# Patient Record
Sex: Male | Born: 1935 | Race: White | Hispanic: No | Marital: Married | Smoking: Former smoker
Health system: Southern US, Community
[De-identification: ages and names within clinical notes are randomized; demographics above are authoritative.]

## PROBLEM LIST (undated history)

## (undated) DIAGNOSIS — N529 Male erectile dysfunction, unspecified: Secondary | ICD-10-CM

## (undated) DIAGNOSIS — C189 Malignant neoplasm of colon, unspecified: Secondary | ICD-10-CM

## (undated) DIAGNOSIS — E785 Hyperlipidemia, unspecified: Secondary | ICD-10-CM

## (undated) DIAGNOSIS — M5136 Other intervertebral disc degeneration, lumbar region: Secondary | ICD-10-CM

## (undated) DIAGNOSIS — E669 Obesity, unspecified: Secondary | ICD-10-CM

## (undated) DIAGNOSIS — E041 Nontoxic single thyroid nodule: Secondary | ICD-10-CM

## (undated) DIAGNOSIS — M1990 Unspecified osteoarthritis, unspecified site: Secondary | ICD-10-CM

## (undated) DIAGNOSIS — I6529 Occlusion and stenosis of unspecified carotid artery: Secondary | ICD-10-CM

## (undated) DIAGNOSIS — K5730 Diverticulosis of large intestine without perforation or abscess without bleeding: Secondary | ICD-10-CM

## (undated) DIAGNOSIS — G709 Myoneural disorder, unspecified: Secondary | ICD-10-CM

## (undated) DIAGNOSIS — R911 Solitary pulmonary nodule: Secondary | ICD-10-CM

## (undated) DIAGNOSIS — D649 Anemia, unspecified: Secondary | ICD-10-CM

## (undated) DIAGNOSIS — I2510 Atherosclerotic heart disease of native coronary artery without angina pectoris: Secondary | ICD-10-CM

## (undated) DIAGNOSIS — R202 Paresthesia of skin: Secondary | ICD-10-CM

## (undated) DIAGNOSIS — I10 Essential (primary) hypertension: Secondary | ICD-10-CM

## (undated) DIAGNOSIS — M51369 Other intervertebral disc degeneration, lumbar region without mention of lumbar back pain or lower extremity pain: Secondary | ICD-10-CM

## (undated) DIAGNOSIS — K635 Polyp of colon: Secondary | ICD-10-CM

## (undated) DIAGNOSIS — H269 Unspecified cataract: Secondary | ICD-10-CM

## (undated) DIAGNOSIS — I4430 Unspecified atrioventricular block: Secondary | ICD-10-CM

## (undated) DIAGNOSIS — R609 Edema, unspecified: Secondary | ICD-10-CM

## (undated) HISTORY — DX: Atherosclerotic heart disease of native coronary artery without angina pectoris: I25.10

## (undated) HISTORY — DX: Anemia, unspecified: D64.9

## (undated) HISTORY — PX: POLYPECTOMY: SHX149

## (undated) HISTORY — DX: Unspecified osteoarthritis, unspecified site: M19.90

## (undated) HISTORY — DX: Myoneural disorder, unspecified: G70.9

## (undated) HISTORY — DX: Occlusion and stenosis of unspecified carotid artery: I65.29

## (undated) HISTORY — DX: Unspecified cataract: H26.9

## (undated) HISTORY — DX: Diverticulosis of large intestine without perforation or abscess without bleeding: K57.30

## (undated) HISTORY — DX: Polyp of colon: K63.5

## (undated) HISTORY — PX: MOHS SURGERY: SUR867

## (undated) HISTORY — DX: Malignant neoplasm of colon, unspecified: C18.9

## (undated) HISTORY — DX: Nontoxic single thyroid nodule: E04.1

## (undated) HISTORY — DX: Other intervertebral disc degeneration, lumbar region: M51.36

## (undated) HISTORY — DX: Hyperlipidemia, unspecified: E78.5

## (undated) HISTORY — DX: Male erectile dysfunction, unspecified: N52.9

## (undated) HISTORY — DX: Solitary pulmonary nodule: R91.1

## (undated) HISTORY — DX: Unspecified atrioventricular block: I44.30

## (undated) HISTORY — PX: UPPER GASTROINTESTINAL ENDOSCOPY: SHX188

## (undated) HISTORY — DX: Paresthesia of skin: R20.2

## (undated) HISTORY — DX: Edema, unspecified: R60.9

## (undated) HISTORY — DX: Obesity, unspecified: E66.9

## (undated) HISTORY — PX: COLONOSCOPY: SHX174

## (undated) HISTORY — DX: Other intervertebral disc degeneration, lumbar region without mention of lumbar back pain or lower extremity pain: M51.369

## (undated) HISTORY — PX: TONSILLECTOMY AND ADENOIDECTOMY: SUR1326

## (undated) HISTORY — DX: Essential (primary) hypertension: I10

## (undated) HISTORY — PX: CATARACT EXTRACTION: SUR2

---

## 1998-03-22 ENCOUNTER — Encounter: Payer: Self-pay | Admitting: Gastroenterology

## 1998-03-22 ENCOUNTER — Ambulatory Visit (HOSPITAL_COMMUNITY): Admission: RE | Admit: 1998-03-22 | Discharge: 1998-03-22 | Payer: Self-pay | Admitting: Gastroenterology

## 2006-02-20 ENCOUNTER — Ambulatory Visit: Payer: Self-pay | Admitting: Gastroenterology

## 2006-03-05 ENCOUNTER — Encounter (INDEPENDENT_AMBULATORY_CARE_PROVIDER_SITE_OTHER): Payer: Self-pay | Admitting: Specialist

## 2006-03-05 ENCOUNTER — Ambulatory Visit: Payer: Self-pay | Admitting: Gastroenterology

## 2006-04-05 ENCOUNTER — Ambulatory Visit: Payer: Self-pay | Admitting: Gastroenterology

## 2006-08-16 ENCOUNTER — Ambulatory Visit: Payer: Self-pay | Admitting: Gastroenterology

## 2006-08-23 ENCOUNTER — Ambulatory Visit (HOSPITAL_COMMUNITY): Admission: RE | Admit: 2006-08-23 | Discharge: 2006-08-23 | Payer: Self-pay | Admitting: Gastroenterology

## 2006-08-28 ENCOUNTER — Ambulatory Visit: Payer: Self-pay | Admitting: Gastroenterology

## 2009-06-12 HISTORY — PX: COLON RESECTION: SHX5231

## 2009-08-03 ENCOUNTER — Encounter (INDEPENDENT_AMBULATORY_CARE_PROVIDER_SITE_OTHER): Payer: Self-pay | Admitting: *Deleted

## 2009-11-16 ENCOUNTER — Encounter (INDEPENDENT_AMBULATORY_CARE_PROVIDER_SITE_OTHER): Payer: Self-pay | Admitting: *Deleted

## 2009-11-18 ENCOUNTER — Ambulatory Visit: Payer: Self-pay | Admitting: Gastroenterology

## 2009-11-25 ENCOUNTER — Ambulatory Visit: Payer: Self-pay | Admitting: Gastroenterology

## 2009-11-26 ENCOUNTER — Telehealth: Payer: Self-pay | Admitting: Gastroenterology

## 2009-11-26 ENCOUNTER — Ambulatory Visit: Payer: Self-pay | Admitting: Cardiology

## 2009-11-26 ENCOUNTER — Ambulatory Visit: Payer: Self-pay | Admitting: Gastroenterology

## 2009-11-26 DIAGNOSIS — C189 Malignant neoplasm of colon, unspecified: Secondary | ICD-10-CM | POA: Insufficient documentation

## 2009-11-26 LAB — CONVERTED CEMR LAB: Creatinine, Ser: 0.9 mg/dL (ref 0.4–1.5)

## 2009-11-30 ENCOUNTER — Encounter: Payer: Self-pay | Admitting: Gastroenterology

## 2009-12-16 ENCOUNTER — Inpatient Hospital Stay (HOSPITAL_COMMUNITY): Admission: RE | Admit: 2009-12-16 | Discharge: 2009-12-20 | Payer: Self-pay | Admitting: Surgery

## 2009-12-16 ENCOUNTER — Encounter (INDEPENDENT_AMBULATORY_CARE_PROVIDER_SITE_OTHER): Payer: Self-pay | Admitting: Surgery

## 2009-12-20 ENCOUNTER — Encounter: Payer: Self-pay | Admitting: Gastroenterology

## 2009-12-27 ENCOUNTER — Ambulatory Visit: Payer: Self-pay | Admitting: Hematology and Oncology

## 2010-01-04 ENCOUNTER — Encounter: Payer: Self-pay | Admitting: Gastroenterology

## 2010-01-04 LAB — CBC WITH DIFFERENTIAL/PLATELET
BASO%: 0.5 % (ref 0.0–2.0)
Basophils Absolute: 0 10e3/uL (ref 0.0–0.1)
EOS%: 1.2 % (ref 0.0–7.0)
Eosinophils Absolute: 0.1 10e3/uL (ref 0.0–0.5)
HCT: 32.7 % — ABNORMAL LOW (ref 38.4–49.9)
HGB: 11.6 g/dL — ABNORMAL LOW (ref 13.0–17.1)
LYMPH%: 16.7 % (ref 14.0–49.0)
MCH: 34 pg — ABNORMAL HIGH (ref 27.2–33.4)
MCHC: 35.5 g/dL (ref 32.0–36.0)
MCV: 95.8 fL (ref 79.3–98.0)
MONO#: 0.6 10e3/uL (ref 0.1–0.9)
MONO%: 11.8 % (ref 0.0–14.0)
NEUT#: 3.4 10e3/uL (ref 1.5–6.5)
NEUT%: 69.8 % (ref 39.0–75.0)
Platelets: 369 10e3/uL (ref 140–400)
RBC: 3.41 10e6/uL — ABNORMAL LOW (ref 4.20–5.82)
RDW: 13 % (ref 11.0–14.6)
WBC: 4.9 10e3/uL (ref 4.0–10.3)
lymph#: 0.8 10e3/uL — ABNORMAL LOW (ref 0.9–3.3)

## 2010-01-04 LAB — CEA: CEA: 1 ng/mL (ref 0.0–5.0)

## 2010-01-04 LAB — COMPREHENSIVE METABOLIC PANEL WITH GFR
ALT: 20 U/L (ref 0–53)
AST: 20 U/L (ref 0–37)
Albumin: 4.2 g/dL (ref 3.5–5.2)
Alkaline Phosphatase: 66 U/L (ref 39–117)
BUN: 24 mg/dL — ABNORMAL HIGH (ref 6–23)
CO2: 19 meq/L (ref 19–32)
Calcium: 9.6 mg/dL (ref 8.4–10.5)
Chloride: 99 meq/L (ref 96–112)
Creatinine, Ser: 1.2 mg/dL (ref 0.40–1.50)
Glucose, Bld: 109 mg/dL — ABNORMAL HIGH (ref 70–99)
Potassium: 4.5 meq/L (ref 3.5–5.3)
Sodium: 134 meq/L — ABNORMAL LOW (ref 135–145)
Total Bilirubin: 0.9 mg/dL (ref 0.3–1.2)
Total Protein: 6.5 g/dL (ref 6.0–8.3)

## 2010-01-04 LAB — IRON AND TIBC
Iron: 112 ug/dL (ref 42–165)
UIBC: 213 ug/dL

## 2010-01-04 LAB — FERRITIN: Ferritin: 374 ng/mL — ABNORMAL HIGH (ref 22–322)

## 2010-01-05 ENCOUNTER — Encounter: Payer: Self-pay | Admitting: Gastroenterology

## 2010-01-05 ENCOUNTER — Ambulatory Visit (HOSPITAL_COMMUNITY): Admission: RE | Admit: 2010-01-05 | Discharge: 2010-01-05 | Payer: Self-pay | Admitting: Hematology and Oncology

## 2010-05-12 ENCOUNTER — Ambulatory Visit: Payer: Self-pay | Admitting: Hematology and Oncology

## 2010-05-16 ENCOUNTER — Ambulatory Visit (HOSPITAL_COMMUNITY)
Admission: RE | Admit: 2010-05-16 | Discharge: 2010-05-16 | Payer: Self-pay | Source: Home / Self Care | Admitting: Hematology and Oncology

## 2010-05-18 ENCOUNTER — Encounter: Payer: Self-pay | Admitting: Gastroenterology

## 2010-05-20 ENCOUNTER — Ambulatory Visit (HOSPITAL_COMMUNITY)
Admission: RE | Admit: 2010-05-20 | Discharge: 2010-05-20 | Payer: Self-pay | Source: Home / Self Care | Attending: Hematology and Oncology | Admitting: Hematology and Oncology

## 2010-07-01 ENCOUNTER — Other Ambulatory Visit: Payer: Self-pay | Admitting: Hematology and Oncology

## 2010-07-01 DIAGNOSIS — C189 Malignant neoplasm of colon, unspecified: Secondary | ICD-10-CM

## 2010-07-12 NOTE — Letter (Signed)
Summary: [facility]   Imported By: Lester H[place] 01/10/2010 10:51:52  _____________________________________________________________________  External Attachment:    Type:   Image     Comment:   External Document

## 2010-07-12 NOTE — Letter (Signed)
Summary: [facility]   Imported By: Lester Trimble 02/22/2010 09:43:51  _____________________________________________________________________  External Attachment:    Type:   Image     Comment:   External Document

## 2010-07-12 NOTE — Letter (Signed)
Summary: Colonoscopy Letter  [facility]  [address] [place], [place] [address]   Phone: [phone]  Fax: [phone]      August 03, 2009 MRN: [id number]   Ronnie Robinson [address] [place], [place]  [address]   Dear Mr. WITHERS,   According to your medical record, it is time for you to schedule a Colonoscopy. The American Cancer Society recommends this procedure as a method to detect early colon cancer. Patients with a family history of colon cancer, or a personal history of colon polyps or inflammatory bowel disease are at increased risk.  This letter has beeen generated based on the recommendations made at the time of your procedure. If you feel that in your particular situation this may no longer apply, please contact our office.  Please call our office at [phone] to schedule this appointment or to update your records at your earliest convenience.  Thank you for cooperating with [place] to provide you with the very best care possible.   Sincerely,  Barbette Hair. Arlyce Dice, M.D.  [facility] [phone]

## 2010-07-12 NOTE — Procedures (Signed)
Summary: Colonoscopy  Patient: Ronnie Robinson Note: All result statuses are Final unless otherwise noted.  Tests: (1) Colonoscopy (COL)   COL Colonoscopy           DONE     [facility]     [address]     [place], [place]  [address]           COLONOSCOPY PROCEDURE REPORT           PATIENT:  [name], [name]  MR#:  [id number]     BIRTHDATE:  27-Oct-1931, 78 yrs. old  GENDER:  male           ENDOSCOPIST:  Barbette Hair. Arlyce Dice, MD     Referred by:           PROCEDURE DATE:  11/25/2009     PROCEDURE:  Colonoscopy with biopsy     ASA CLASS:  Class II     INDICATIONS:  1) screening  2) history of pre-cancerous     (adenomatous) colon polyps           MEDICATIONS:   Fentanyl 50 mcg IV, Versed 6 mg IV           DESCRIPTION OF PROCEDURE:   After the risks benefits and     alternatives of the procedure were thoroughly explained, informed     consent was obtained.  No rectal exam performed. The LB CF-[id number]     [id number] endoscope was introduced through the anus and advanced to     the cecum, which was identified by both the appendix and ileocecal     valve, without limitations.  The quality of the prep was     excellent, using MoviPrep.  The instrument was then slowly     withdrawn as the colon was fully examined.     <<PROCEDUREIMAGES>>           FINDINGS:  A sessile polyp was found in the ascending colon. It     was 2 cm in size. Sessile relatively flat polyp proximal ascending     colon. Multiple biopsies were taken (see image7 and image3).     Severe diverticulosis was found in the sigmoid colon (see image2).     Scattered diverticula were found (see image1 and image9).     descending colon to ascending colon  This was otherwise a normal     examination of the colon (see image5, image6, image10, image12,     image18, and image19).   Retroflexed views in the rectum revealed     no abnormalities.    The time to cecum =  5.0  minutes. The scope     was then withdrawn (time =  6.25   min) from the patient and the     procedure completed.           COMPLICATIONS:  None           ENDOSCOPIC IMPRESSION:     1) 2 cm sessile polyp in the ascending colon     2) Severe diverticulosis in the sigmoid colon     3) Diverticula, scattered     4) Otherwise normal examination     RECOMMENDATIONS:     1) Await biopsy results           REPEAT EXAM:   You will receive a letter from Dr. Arlyce Dice in 1-2     weeks, after reviewing the final pathology, with followup     recommendations.  ______________________________     Barbette Hair Arlyce Dice, MD           CC: Sigmund Hazel, MD           n.     Rosalie Doctor:   Barbette Hair. Evadene Wardrip at 11/25/2009 11:07 AM           Page 2 of 3   [place]gomery, Favor [id number]  Note: An exclamation mark (!) indicates a result that was not dispersed into the flowsheet. Document Creation Date: 11/25/2009 11:07 AM _______________________________________________________________________  (1) Order result status: Final Collection or observation date-time: 11/25/2009 10:57 Requested date-time:  Receipt date-time:  Reported date-time:  Referring Physician:   Ordering Physician: Melvia Heaps [phone]) Specimen Source:  Source: Launa Grill Order Number: [id number] Lab site:

## 2010-07-12 NOTE — Letter (Signed)
[facility]   Imported By: Sherian Rein 01/24/2010 12:24:09  _____________________________________________________________________  External Attachment:    Type:   Image     Comment:   External Document

## 2010-07-12 NOTE — Letter (Signed)
Summary: [facility] Instructions  [facility]  [address] [place], [place] [address]   Phone: [phone]  Fax: [phone]       Ronnie Robinson    February 21, 1932    MRN: [id number]        Procedure Day /Date:  Thursday 11/25/2009     Arrival Time: 9:30 am      Procedure Time: 10:30 am     Location of Procedure:                    _x _  [facility] (4th Floor)                        PREPARATION FOR COLONOSCOPY WITH MOVIPREP   Starting 5 days prior to your procedure Saturday 6/11 do not eat nuts, seeds, popcorn, corn, beans, peas,  salads, or any raw vegetables.  Do not take any fiber supplements (e.g. Metamucil, Citrucel, and Benefiber).  THE DAY BEFORE YOUR PROCEDURE         DATE: Wednesday 6/15  1.  Drink clear liquids the entire day-NO SOLID FOOD  2.  Do not drink anything colored red or purple.  Avoid juices with pulp.  No orange juice.  3.  Drink at least 64 oz. (8 glasses) of fluid/clear liquids during the day to prevent dehydration and help the prep work efficiently.  CLEAR LIQUIDS INCLUDE: Water Jello Ice Popsicles Tea (sugar ok, no milk/cream) Powdered fruit flavored drinks Coffee (sugar ok, no milk/cream) Gatorade Juice: apple, white grape, white cranberry  Lemonade Clear bullion, consomm, broth Carbonated beverages (any kind) Strained chicken noodle soup Hard Candy                             4.  In the morning, mix first dose of MoviPrep solution:    Empty 1 Pouch A and 1 Pouch B into the disposable container    Add lukewarm drinking water to the top line of the container. Mix to dissolve    Refrigerate (mixed solution should be used within 24 hrs)  5.  Begin drinking the prep at 5:00 p.m. The MoviPrep container is divided by 4 marks.   Every 15 minutes drink the solution down to the next mark (approximately 8 oz) until the full liter is complete.   6.  Follow completed prep with 16 oz of clear liquid of your choice  (Nothing red or purple).  Continue to drink clear liquids until bedtime.  7.  Before going to bed, mix second dose of MoviPrep solution:    Empty 1 Pouch A and 1 Pouch B into the disposable container    Add lukewarm drinking water to the top line of the container. Mix to dissolve    Refrigerate  THE DAY OF YOUR PROCEDURE      DATE: Thursday 6/16  Beginning at 5:30 a.m. (5 hours before procedure):         1. Every 15 minutes, drink the solution down to the next mark (approx 8 oz) until the full liter is complete.  2. Follow completed prep with 16 oz. of clear liquid of your choice.    3. You may drink clear liquids until 8:30 am  (2 HOURS BEFORE PROCEDURE).   MEDICATION INSTRUCTIONS  Unless otherwise instructed, you should take regular prescription medications with a small sip of water   as early as possible the morning  of your procedure.  Additional medication instructions: Do not take HCTZ morning of procedure.                                                  Stop Iron 5 days before procedure.       OTHER INSTRUCTIONS  You will need a responsible adult at least 75 years of age to accompany you and drive you home.   This person must remain in the waiting room during your procedure.  Wear loose fitting clothing that is easily removed.  Leave jewelry and other valuables at home.  However, you may wish to bring a book to read or  an iPod/MP3 player to listen to music as you wait for your procedure to start.  Remove all body piercing jewelry and leave at home.  Total time from sign-in until discharge is approximately 2-3 hours.  You should go home directly after your procedure and rest.  You can resume normal activities the  day after your procedure.  The day of your procedure you should not:   Drive   Make legal decisions   Operate machinery   Drink alcohol   Return to work  You will receive specific instructions about eating, activities and medications  before you leave.    The above instructions have been reviewed and explained to me by   _______________________    I fully understand and can verbalize these instructions _____________________________ Date _________

## 2010-07-12 NOTE — Letter (Signed)
Summary: [facility]   Imported By: Sherian Rein 12/22/2009 09:45:44  _____________________________________________________________________  External Attachment:    Type:   Image     Comment:   External Document

## 2010-07-12 NOTE — Miscellaneous (Signed)
Summary: [facility]  Clinical Lists Changes  Medications: Added new medication of MOVIPREP 100 GM  SOLR (PEG-KCL-NACL-NASULF-NA ASC-C) As per prep instructions. - Signed Rx of MOVIPREP 100 GM  SOLR (PEG-KCL-NACL-NASULF-NA ASC-C) As per prep instructions.;  #1 x 0;  Signed;  Entered by: Ezra Sites RN;  Authorized by: Louis Meckel MD;  Method used: Electronically to [facility]  [phone]*, [address] [place], [place], [place]  [address], Ph: [phone] or [phone], Fax: [phone] Observations: Added new observation of NKA: T (11/18/2009 8:57)    Prescriptions: MOVIPREP 100 GM  SOLR (PEG-KCL-NACL-NASULF-NA ASC-C) As per prep instructions.  #1 x 0   Entered by:   Ezra Sites RN   Authorized by:   Louis Meckel MD   Signed by:   Ezra Sites RN on 11/18/2009   Method used:   Electronically to        [facility]  [phone]* (retail)       [address]       [place], [place]  [address]       Ph: [phone] or [phone]       Fax: [phone]   RxID:   [id number]

## 2010-07-12 NOTE — Progress Notes (Signed)
Summary: CT and Surgical Consult Scheduled  Phone Note Outgoing Call   Call placed by: Laureen Ochs LPN,  November 26, 2009 9:46 AM Call placed to: Patient Summary of Call: Pt. is scheduled for a CT today at 2pm at [facility] and he will see Dr.Gross in the [place] office on 11-30-09 at 3pm. Pt. will come for CT prep and labs this morning. All instructions reviewed w/pt. by phone. Pt. instructed to call back as needed.  Initial call taken by: Laureen Ochs LPN,  November 26, 2009 9:47 AM  Follow-up for Phone Call        ok.  please ask Dr. 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

## 2010-07-12 NOTE — Progress Notes (Signed)
  Phone Note Outgoing Call   Call placed by: Louis Meckel MD,  November 26, 2009 9:22 AM Summary of Call: I informed the patient that he has a cancer of the right colon.  We will be setting up a CT scan and arrange for a surgical consult.

## 2010-07-14 NOTE — Letter (Signed)
Summary: [facility]  [facility]   Imported By: Lester Elaine 06/01/2010 11:48:57  _____________________________________________________________________  External Attachment:    Type:   Image     Comment:   External Document

## 2010-08-28 LAB — BASIC METABOLIC PANEL
CO2: 25 mEq/L (ref 19–32)
Chloride: 98 mEq/L (ref 96–112)
Creatinine, Ser: 1.23 mg/dL (ref 0.4–1.5)
GFR calc Af Amer: >60 mL/min (ref >60)
Potassium: 4.1 mEq/L (ref 3.5–5.1)
Sodium: 132 mEq/L — ABNORMAL LOW (ref 135–145)

## 2010-08-28 LAB — CBC
HCT: 36.5 % — ABNORMAL LOW (ref 39.0–52.0)
Hemoglobin: 12.7 g/dL — ABNORMAL LOW (ref 13.0–17.0)
MCH: 34.4 pg — ABNORMAL HIGH (ref 26.0–34.0)
MCH: 33.7 pg (ref 26.0–34.0)
MCV: 97.1 fL (ref 78.0–100.0)
MCV: 96.6 fL (ref 78.0–100.0)
Platelets: 200 10*3/uL (ref 150–400)
Platelets: 231 10*3/uL (ref 150–400)
RBC: 3.42 MIL/uL — ABNORMAL LOW (ref 4.22–5.81)
RBC: 3.78 MIL/uL — ABNORMAL LOW (ref 4.22–5.81)
RDW: 13.4 % (ref 11.5–15.5)
WBC: 6.3 10*3/uL (ref 4.0–10.5)

## 2010-08-28 LAB — CREATININE, SERUM: GFR calc non Af Amer: >60 mL/min (ref >60)

## 2010-08-28 LAB — SURGICAL PCR SCREEN: MRSA, PCR: NEGATIVE

## 2010-09-14 ENCOUNTER — Other Ambulatory Visit: Payer: Self-pay | Admitting: Hematology and Oncology

## 2010-09-14 ENCOUNTER — Ambulatory Visit (HOSPITAL_COMMUNITY)
Admission: RE | Admit: 2010-09-14 | Discharge: 2010-09-14 | Disposition: A | Discharge status: Home / Self Care | Payer: Medicare Other | Source: Ambulatory Visit | Attending: Hematology and Oncology | Admitting: Hematology and Oncology

## 2010-09-14 ENCOUNTER — Other Ambulatory Visit (HOSPITAL_COMMUNITY): Payer: Self-pay

## 2010-09-14 ENCOUNTER — Encounter (HOSPITAL_BASED_OUTPATIENT_CLINIC_OR_DEPARTMENT_OTHER): Payer: Medicare Other | Admitting: Hematology and Oncology

## 2010-09-14 DIAGNOSIS — R911 Solitary pulmonary nodule: Secondary | ICD-10-CM | POA: Insufficient documentation

## 2010-09-14 DIAGNOSIS — I10 Essential (primary) hypertension: Secondary | ICD-10-CM

## 2010-09-14 DIAGNOSIS — K869 Disease of pancreas, unspecified: Secondary | ICD-10-CM | POA: Insufficient documentation

## 2010-09-14 DIAGNOSIS — C189 Malignant neoplasm of colon, unspecified: Secondary | ICD-10-CM

## 2010-09-14 DIAGNOSIS — Z09 Encounter for follow-up examination after completed treatment for conditions other than malignant neoplasm: Secondary | ICD-10-CM | POA: Insufficient documentation

## 2010-09-14 DIAGNOSIS — N62 Hypertrophy of breast: Secondary | ICD-10-CM | POA: Insufficient documentation

## 2010-09-14 DIAGNOSIS — D0439 Carcinoma in situ of skin of other parts of face: Secondary | ICD-10-CM

## 2010-09-14 DIAGNOSIS — Z85038 Personal history of other malignant neoplasm of large intestine: Secondary | ICD-10-CM | POA: Insufficient documentation

## 2010-09-14 DIAGNOSIS — E041 Nontoxic single thyroid nodule: Secondary | ICD-10-CM | POA: Insufficient documentation

## 2010-09-14 DIAGNOSIS — E7800 Pure hypercholesterolemia, unspecified: Secondary | ICD-10-CM

## 2010-09-14 LAB — CBC WITH DIFFERENTIAL/PLATELET
BASO%: 0.2 % (ref 0.0–2.0)
Basophils Absolute: 0 10*3/uL (ref 0.0–0.1)
EOS%: 1.3 % (ref 0.0–7.0)
HCT: 34.5 % — ABNORMAL LOW (ref 38.4–49.9)
HGB: 12.3 g/dL — ABNORMAL LOW (ref 13.0–17.1)
LYMPH%: 16.7 % (ref 14.0–49.0)
MCH: 33.9 pg — ABNORMAL HIGH (ref 27.2–33.4)
MCHC: 35.7 g/dL (ref 32.0–36.0)
NEUT%: 72.2 % (ref 39.0–75.0)
Platelets: 233 10*3/uL (ref 140–400)
lymph#: 0.8 10*3/uL — ABNORMAL LOW (ref 0.9–3.3)

## 2010-09-14 LAB — CMP (CANCER CENTER ONLY)
AST: 39 U/L — ABNORMAL HIGH (ref 11–38)
BUN, Bld: 14 mg/dL (ref 7–22)
CO2: 27 mEq/L (ref 18–33)
Calcium: 8.6 mg/dL (ref 8.0–10.3)
Chloride: 96 mEq/L — ABNORMAL LOW (ref 98–108)
Creat: 0.9 mg/dl (ref 0.6–1.2)
Total Bilirubin: 1.4 mg/dl (ref 0.20–1.60)

## 2010-09-14 LAB — CEA: CEA: 1 ng/mL (ref 0.0–5.0)

## 2010-09-14 MED ORDER — IOHEXOL 300 MG/ML  SOLN
100.0000 mL | Freq: Once | INTRAMUSCULAR | Status: AC | PRN
  Administered 2010-09-14: 100 mL via INTRAVENOUS

## 2010-09-21 ENCOUNTER — Telehealth: Payer: Self-pay | Admitting: Gastroenterology

## 2010-09-21 ENCOUNTER — Encounter (HOSPITAL_BASED_OUTPATIENT_CLINIC_OR_DEPARTMENT_OTHER): Payer: Medicare Other | Admitting: Hematology and Oncology

## 2010-09-21 DIAGNOSIS — I10 Essential (primary) hypertension: Secondary | ICD-10-CM

## 2010-09-21 DIAGNOSIS — E7800 Pure hypercholesterolemia, unspecified: Secondary | ICD-10-CM

## 2010-09-21 DIAGNOSIS — D0439 Carcinoma in situ of skin of other parts of face: Secondary | ICD-10-CM

## 2010-09-21 DIAGNOSIS — C189 Malignant neoplasm of colon, unspecified: Secondary | ICD-10-CM

## 2010-09-21 NOTE — Telephone Encounter (Signed)
LMOM for pt to call back. Per Dr Michaell Cowing' notes on 01/05/2010 office visit, He stated pt should have a COLON in July, 2012.

## 2010-09-21 NOTE — Telephone Encounter (Signed)
yes

## 2010-09-21 NOTE — Telephone Encounter (Signed)
Informed pt of Dr Gordy Savers note that he needs a repeat COLON this July. Pt stated his Oncologist stated he needs one as well as his PCP. Pt stated he will be out of town this July and August. Dr Arlyce Dice, is it ok to schedule his COLON early?

## 2010-09-21 NOTE — Telephone Encounter (Signed)
Lmom at home and cell for pt to call to schedule his COLON.

## 2010-09-22 ENCOUNTER — Encounter: Payer: Self-pay | Admitting: *Deleted

## 2010-09-22 NOTE — Telephone Encounter (Signed)
Scheduled pt for COLON on 11/15/10 at 9am and for a previsit on 11/08/10 at 10am. Pt verbalized understanding.

## 2010-09-22 NOTE — Telephone Encounter (Signed)
lmom for pt to call back

## 2010-10-28 NOTE — Assessment & Plan Note (Signed)
[facility]                           GASTROENTEROLOGY OFFICE NOTE   [name], [name]                        MRN:          [id number]  DATE:04/05/2006                            DOB:          11/30/1931    PROBLEM:  Colon polyp.   REASON:  Ronnie Robinson has returned following colonoscopy.  A 15 mm supine polyp  was removed.  In the colon he had an 18 mm sessile polyp which was removed.  I felt that he may have some polyps remnants remaining.  Pathology  demonstrated adenomatous changes only.  Ronnie Robinson feels well and has  complaints.  He was referred because of a mild anemia.  On January 26, 2006  hemoglobin was 12.3 and MCV 97.   PHYSICAL EXAMINATION:  Pulse 16, blood pressure 136/60, weight 218.   IMPRESSION:  1. Sessile colon cecal polyp.  He may have some polyp remnants.  2. Mild anemia.   RECOMMENDATIONS:  1. Followup colonoscopy in approximately 6 months to remove any polyp      remnants.  2. Check serum iron, __________  ferritin folate and B12 levels.     Barbette Hair. Arlyce Dice, MD,FACG    RDK/MedQ  DD: 04/05/2006  DT: 04/06/2006  Job #: [id number]   cc:   Chales Salmon. Abigail Miyamoto, M.D.

## 2010-10-28 NOTE — Letter (Signed)
February 20, 2006     Chales Salmon. Abigail Miyamoto, M.D.  [address] [place], [place] [address]   RE:  CHARLTON, BOULE  MRN:  [id number]  /  DOB:  04-10-1932   Dear Dr. Abigail Miyamoto:   Upon your kind referral, I had the pleasure of evaluating your patient and I  am pleased to offer my findings.  I saw Cleveland Yarbro in the office today.  Enclosed is a copy of my progress note that details my findings and  recommendations.   Thank you for the opportunity to participate in your patient's care.    Sincerely,      Barbette Hair. Arlyce Dice, MD,FACG   RDK/MedQ  DD:  02/20/2006  DT:  02/21/2006  Job #:  [id number]

## 2010-10-28 NOTE — Assessment & Plan Note (Signed)
[facility]                           GASTROENTEROLOGY OFFICE NOTE   Ronnie Robinson, Ronnie Robinson                        MRN:          [id number]  DATE:02/20/2006                            DOB:          04/15/1932    REASON FOR CONSULTATION:  Anemia.   HISTORY:  Mr. Behrle is a 77 year old white male referred through the  courtesy of Dr. Abigail Miyamoto for evaluation.  Routine testing demonstrated a mild  anemia (lab work is not available at the time of this dictation).  Mr. Flanagin  has no GI complaints including abdominal pain, change of bowel habits,  melena or hematochezia.  He has a history of diverticulosis determined by  colonoscopy nine years ago.  He is on no gastric irritants including  nonsteroidals or aspirin.   PAST MEDICAL HISTORY:  Hypertension.   FAMILY HISTORY:  Positive for father with prostate cancer.   MEDICATIONS:  1. Atenolol.  2. Benicar.  3. Hydrochlorothiazide.  4. Lipitor.  5. Aspirin.  6. Metamucil.   ALLERGIES:  NO KNOWN DRUG ALLERGIES.   He does not smoke.  He drinks occasionally.  He is married and works in  Printmaker.   REVIEW OF SYSTEMS:  Reviewed and positive for joint pains and scalp itching.   PHYSICAL EXAMINATION:  GENERAL APPEARANCE:  He is a healthy-appearing male.  VITAL SIGNS:  Pulse 50, blood pressure 130/66, weight 221.  HEENT:  EOMI. PERRLA. Sclerae are anicteric.  Conjunctivae are pink.  NECK:  Supple without thyromegaly, adenopathy or carotid bruits.  CHEST:  Clear to auscultation and percussion without adventitious sounds.  CARDIAC:  Regular rhythm; normal S1 S2.  There are no murmurs, gallops or  rubs.  ABDOMEN:  Bowel sounds are normoactive.  Abdomen is soft, non-tender and non-  distended.  There are no abdominal masses, tenderness, splenic enlargement  or hepatomegaly.  EXTREMITIES:  Full range of motion.  No cyanosis, clubbing or edema.  RECTAL:  There are no masses.  Stool is Hemoccult  negative.   IMPRESSION:  Anemia.  Information regarding the anemia is limited but there  does not appear to be ongoing gastrointestinal blood loss (heme-negative  stool).   RECOMMENDATION:  Colonoscopy.                                   Barbette Hair. Arlyce Dice, MD,FACG   RDK/MedQ  DD:  02/20/2006  DT:  02/21/2006  Job #:  [id number]   cc:   Chales Salmon. Abigail Miyamoto, M.D.

## 2010-11-08 ENCOUNTER — Encounter: Payer: Self-pay | Admitting: Gastroenterology

## 2010-11-08 ENCOUNTER — Ambulatory Visit (AMBULATORY_SURGERY_CENTER): Payer: Medicare Other

## 2010-11-08 VITALS — Ht 72.0 in | Wt 188.6 lb

## 2010-11-08 DIAGNOSIS — Z85038 Personal history of other malignant neoplasm of large intestine: Secondary | ICD-10-CM

## 2010-11-08 MED ORDER — PEG-KCL-NACL-NASULF-NA ASC-C 100 G PO SOLR: 1.0000 | Freq: Once | ORAL | Status: AC

## 2010-11-15 ENCOUNTER — Ambulatory Visit (AMBULATORY_SURGERY_CENTER): Payer: Medicare Other | Admitting: Gastroenterology

## 2010-11-15 ENCOUNTER — Encounter: Payer: Self-pay | Admitting: Gastroenterology

## 2010-11-15 VITALS — BP 143/69 | HR 51 | Temp 96.1°F | Resp 14 | Ht 72.0 in | Wt 188.0 lb

## 2010-11-15 DIAGNOSIS — Z85038 Personal history of other malignant neoplasm of large intestine: Secondary | ICD-10-CM

## 2010-11-15 DIAGNOSIS — K5730 Diverticulosis of large intestine without perforation or abscess without bleeding: Secondary | ICD-10-CM

## 2010-11-15 DIAGNOSIS — Z1211 Encounter for screening for malignant neoplasm of colon: Secondary | ICD-10-CM

## 2010-11-15 DIAGNOSIS — K648 Other hemorrhoids: Secondary | ICD-10-CM

## 2010-11-15 MED ORDER — SODIUM CHLORIDE 0.9 % IV SOLN: 500.0000 mL | INTRAVENOUS | Status: DC

## 2010-11-15 NOTE — Patient Instructions (Addendum)
Please follow you blue & neon green sheets for instructions regarding diet and activity for the rest of today. Findings: Diverticulosis, Internal hemorrhoids Recommendations: High Fiber Diet  Repeat exam in 3 years (2015)  You may resume your medications as you would normally take them.

## 2010-11-16 ENCOUNTER — Telehealth: Payer: Self-pay | Admitting: *Deleted

## 2010-11-16 NOTE — Telephone Encounter (Signed)

## 2010-11-28 ENCOUNTER — Other Ambulatory Visit: Payer: Self-pay | Admitting: Dermatology

## 2011-03-03 ENCOUNTER — Other Ambulatory Visit: Payer: Self-pay | Admitting: Family Medicine

## 2011-03-03 DIAGNOSIS — E041 Nontoxic single thyroid nodule: Secondary | ICD-10-CM

## 2011-03-07 ENCOUNTER — Ambulatory Visit
Admission: RE | Admit: 2011-03-07 | Discharge: 2011-03-07 | Disposition: A | Discharge status: Home / Self Care | Payer: Medicare Other | Source: Ambulatory Visit | Attending: Family Medicine | Admitting: Family Medicine

## 2011-03-07 DIAGNOSIS — E041 Nontoxic single thyroid nodule: Secondary | ICD-10-CM

## 2011-03-28 ENCOUNTER — Other Ambulatory Visit: Payer: Self-pay | Admitting: Hematology and Oncology

## 2011-03-28 ENCOUNTER — Encounter (HOSPITAL_BASED_OUTPATIENT_CLINIC_OR_DEPARTMENT_OTHER): Payer: Medicare Other | Admitting: Hematology and Oncology

## 2011-03-28 DIAGNOSIS — C189 Malignant neoplasm of colon, unspecified: Secondary | ICD-10-CM

## 2011-03-28 DIAGNOSIS — D0439 Carcinoma in situ of skin of other parts of face: Secondary | ICD-10-CM

## 2011-03-28 DIAGNOSIS — I10 Essential (primary) hypertension: Secondary | ICD-10-CM

## 2011-03-28 DIAGNOSIS — E7800 Pure hypercholesterolemia, unspecified: Secondary | ICD-10-CM

## 2011-03-28 LAB — COMPREHENSIVE METABOLIC PANEL WITH GFR
ALT: 29 U/L (ref 0–53)
AST: 28 U/L (ref 0–37)
Albumin: 3.8 g/dL (ref 3.5–5.2)
Alkaline Phosphatase: 67 U/L (ref 39–117)
BUN: 19 mg/dL (ref 6–23)
CO2: 27 meq/L (ref 19–32)
Calcium: 9.4 mg/dL (ref 8.4–10.5)
Chloride: 98 meq/L (ref 96–112)
Creatinine, Ser: 1 mg/dL (ref 0.50–1.35)
Glucose, Bld: 104 mg/dL — ABNORMAL HIGH (ref 70–99)
Potassium: 3.6 meq/L (ref 3.5–5.3)
Sodium: 136 meq/L (ref 135–145)
Total Bilirubin: 1.1 mg/dL (ref 0.3–1.2)
Total Protein: 7 g/dL (ref 6.0–8.3)

## 2011-03-28 LAB — CBC WITH DIFFERENTIAL/PLATELET
BASO%: 0.7 % (ref 0.0–2.0)
EOS%: 1.8 % (ref 0.0–7.0)
HCT: 37.6 % — ABNORMAL LOW (ref 38.4–49.9)
MCH: 34.1 pg — ABNORMAL HIGH (ref 27.2–33.4)
MCHC: 35.2 g/dL (ref 32.0–36.0)
NEUT%: 66.2 % (ref 39.0–75.0)
RBC: 3.89 10*6/uL — ABNORMAL LOW (ref 4.20–5.82)
RDW: 13.6 % (ref 11.0–14.6)
lymph#: 1.1 10*3/uL (ref 0.9–3.3)

## 2011-03-28 LAB — CEA: CEA: 0.8 ng/mL (ref 0.0–5.0)

## 2011-04-04 ENCOUNTER — Encounter (HOSPITAL_BASED_OUTPATIENT_CLINIC_OR_DEPARTMENT_OTHER): Payer: Medicare Other | Admitting: Hematology and Oncology

## 2011-04-04 DIAGNOSIS — Z85038 Personal history of other malignant neoplasm of large intestine: Secondary | ICD-10-CM

## 2011-05-17 ENCOUNTER — Other Ambulatory Visit: Payer: Self-pay | Admitting: Dermatology

## 2011-07-12 ENCOUNTER — Other Ambulatory Visit: Payer: Self-pay | Admitting: Hematology and Oncology

## 2011-07-12 DIAGNOSIS — C189 Malignant neoplasm of colon, unspecified: Secondary | ICD-10-CM

## 2011-07-15 ENCOUNTER — Telehealth: Payer: Self-pay | Admitting: Hematology and Oncology

## 2011-07-15 NOTE — Telephone Encounter (Signed)
S/w the pt and he is aware of the may 2013 appts along with the ct scan appt with instructions.

## 2011-08-28 ENCOUNTER — Other Ambulatory Visit: Payer: Self-pay | Admitting: Dermatology

## 2011-10-16 ENCOUNTER — Other Ambulatory Visit (HOSPITAL_COMMUNITY): Payer: Self-pay | Admitting: Family Medicine

## 2011-10-16 DIAGNOSIS — E041 Nontoxic single thyroid nodule: Secondary | ICD-10-CM

## 2011-10-23 ENCOUNTER — Other Ambulatory Visit (HOSPITAL_BASED_OUTPATIENT_CLINIC_OR_DEPARTMENT_OTHER): Payer: Medicare Other | Admitting: Lab

## 2011-10-23 ENCOUNTER — Ambulatory Visit (HOSPITAL_COMMUNITY)
Admission: RE | Admit: 2011-10-23 | Discharge: 2011-10-23 | Disposition: A | Discharge status: Home / Self Care | Payer: Medicare Other | Source: Ambulatory Visit | Attending: Hematology and Oncology | Admitting: Hematology and Oncology

## 2011-10-23 ENCOUNTER — Other Ambulatory Visit (HOSPITAL_COMMUNITY): Payer: Medicare Other

## 2011-10-23 ENCOUNTER — Encounter (HOSPITAL_COMMUNITY): Payer: Self-pay

## 2011-10-23 DIAGNOSIS — K869 Disease of pancreas, unspecified: Secondary | ICD-10-CM | POA: Insufficient documentation

## 2011-10-23 DIAGNOSIS — C189 Malignant neoplasm of colon, unspecified: Secondary | ICD-10-CM

## 2011-10-23 DIAGNOSIS — K5730 Diverticulosis of large intestine without perforation or abscess without bleeding: Secondary | ICD-10-CM | POA: Insufficient documentation

## 2011-10-23 DIAGNOSIS — N400 Enlarged prostate without lower urinary tract symptoms: Secondary | ICD-10-CM | POA: Insufficient documentation

## 2011-10-23 DIAGNOSIS — K449 Diaphragmatic hernia without obstruction or gangrene: Secondary | ICD-10-CM | POA: Insufficient documentation

## 2011-10-23 DIAGNOSIS — I2510 Atherosclerotic heart disease of native coronary artery without angina pectoris: Secondary | ICD-10-CM | POA: Insufficient documentation

## 2011-10-23 LAB — CBC WITH DIFFERENTIAL/PLATELET
Basophils Absolute: 0 10*3/uL (ref 0.0–0.1)
HCT: 37.2 % — ABNORMAL LOW (ref 38.4–49.9)
HGB: 13.3 g/dL (ref 13.0–17.1)
LYMPH%: 21.5 % (ref 14.0–49.0)
MCH: 33.9 pg — ABNORMAL HIGH (ref 27.2–33.4)
MONO#: 0.5 10*3/uL (ref 0.1–0.9)
NEUT%: 65.5 % (ref 39.0–75.0)
Platelets: 218 10*3/uL (ref 140–400)
WBC: 4.1 10*3/uL (ref 4.0–10.3)
lymph#: 0.9 10*3/uL (ref 0.9–3.3)

## 2011-10-23 LAB — CMP (CANCER CENTER ONLY)
Alkaline Phosphatase: 56 U/L (ref 26–84)
Creat: 0.8 mg/dl (ref 0.6–1.2)
Glucose, Bld: 115 mg/dL (ref 73–118)
Sodium: 138 mEq/L (ref 128–145)
Total Bilirubin: 1.5 mg/dl (ref 0.20–1.60)
Total Protein: 7 g/dL (ref 6.4–8.1)

## 2011-10-23 MED ORDER — IOHEXOL 300 MG/ML  SOLN
100.0000 mL | Freq: Once | INTRAMUSCULAR | Status: AC | PRN
  Administered 2011-10-23: 100 mL via INTRAVENOUS

## 2011-10-24 ENCOUNTER — Ambulatory Visit (HOSPITAL_COMMUNITY)
Admission: RE | Admit: 2011-10-24 | Discharge: 2011-10-24 | Disposition: A | Discharge status: Home / Self Care | Payer: Medicare Other | Source: Ambulatory Visit | Attending: Family Medicine | Admitting: Family Medicine

## 2011-10-24 DIAGNOSIS — E042 Nontoxic multinodular goiter: Secondary | ICD-10-CM | POA: Insufficient documentation

## 2011-10-24 DIAGNOSIS — E041 Nontoxic single thyroid nodule: Secondary | ICD-10-CM

## 2011-10-25 ENCOUNTER — Encounter: Payer: Self-pay | Admitting: Hematology and Oncology

## 2011-10-25 ENCOUNTER — Telehealth: Payer: Self-pay | Admitting: Hematology and Oncology

## 2011-10-25 ENCOUNTER — Ambulatory Visit (HOSPITAL_BASED_OUTPATIENT_CLINIC_OR_DEPARTMENT_OTHER): Payer: Medicare Other | Admitting: Hematology and Oncology

## 2011-10-25 VITALS — BP 117/55 | HR 54 | Temp 96.8°F | Ht 72.0 in | Wt 197.4 lb

## 2011-10-25 DIAGNOSIS — C189 Malignant neoplasm of colon, unspecified: Secondary | ICD-10-CM

## 2011-10-25 NOTE — Telephone Encounter (Signed)
appts made and printed for pt aom °

## 2011-10-25 NOTE — Progress Notes (Signed)
CC:   Ronnie Robinson, M.D. Ardeth Sportsman, MD Barbette Hair Arlyce Dice, MD,FACG [facility] M. Danella Deis, M.D.  IDENTIFYING STATEMENT:  The patient is a 78 year old man with colon cancer who presents for followup.  INTERVAL HISTORY:  The patient was last seen 6 months ago.  He has had no issues or concerns since his last followup visit.  His weight is stable.  He is without nausea, vomiting, or abdominal pain.  He denies rectal bleeding.  His last colonoscopy was performed summer of 2012 and was unremarkable.  We reviewed results of a recent CT scan dated 10/23/2011.  Those results showed that there were no findings to suggest metastatic disease within his chest.  There were a few nodules which were felt to benign and not suspicious.  There is no adenopathy.  The liver was unremarkable.  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 Prostatomegaly was noted.  MEDICATIONS:  Reviewed and updated.  ALLERGIES:  Micardis.  PAST MEDICAL HISTORY/FAMILY HISTORY/SOCIAL HISTORY:  Unchanged.  REVIEW OF SYSTEMS:  Ten point review of systems negative.  Physical exam: Vitals: stable HEENT: Head is atraumatic, normocephalic. Extraocular muscles intact. Sclerae  anicteric. Mouth: Moist without ulcerations, thrush, or lesions.  Neck: Supple without adenopathy and trachea is center. Chest  demonstrates good entry bilaterally and is clear to both percussion and  auscultation. Cardiovascular: 1st and 2nd heart sounds present. No  added sounds or murmurs. Abdomen: Soft, nontender. No  hepatosplenomegaly. Bowel sounds present. Extremities reveal no edema.  Labs on 10/23/2011 notes a white cell count of 4.1, hemoglobin 13.3, hematocrit 37.2, platelets 218.  Sodium 138, potassium 4, chloride 97, CO2 27, BUN 16, creatinine 0.8, glucose 115.  T bilirubin 1.5, alkaline phosphatase 66, AST  33, ALT 32, calcium 8.9. CEA 0.9.   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  None of the 13 lymph nodes sampled had evidence of tumor.  The patient is undergoing surveillance.  His recent exam, CTs and CEA level were not elevated. The patient is scheduled to return in 9 months' time with labs.     ______________________________ Laurice Record, M.D. LIO/MEDQ  D:  10/25/2011  T:  10/25/2011  Job:  [id number]

## 2011-10-25 NOTE — Progress Notes (Signed)
This office note has been dictated.

## 2011-10-25 NOTE — Patient Instructions (Signed)
Ronnie Robinson  [id number]  [facility] Discharge Instructions  RECOMMENDATIONS MADE BY THE CONSULTANT AND ANY TEST RESULTS WILL BE SENT TO YOUR REFERRING DOCTOR.   EXAM FINDINGS BY MD TODAY AND SIGNS AND SYMPTOMS TO REPORT TO CLINIC OR PRIMARY MD:   Your current list of medications are: Current Outpatient Prescriptions  Medication Sig Dispense Refill  . amLODipine (NORVASC) 5 MG tablet Take 5 mg by mouth daily.        . Ascorbic Acid (VITAMIN C WITH ROSE HIPS) 500 MG tablet Take 500 mg by mouth daily.        Marland Kitchen aspirin 325 MG tablet Take 325 mg by mouth daily.        Marland Kitchen atenolol (TENORMIN) 25 MG tablet Take 25 mg by mouth daily.        Marland Kitchen atorvastatin (LIPITOR) 40 MG tablet Take 40 mg by mouth daily.        . Cyanocobalamin (VITAMIN B 12 PO) Take by mouth daily. Take 100  mcg daily       . fish oil-omega-3 fatty acids 1000 MG capsule Take 1 g by mouth 2 (two) times daily. Take 1200 mg bid       . hydrALAZINE (APRESOLINE) 100 MG tablet Take 100 mg by mouth 2 (two) times daily.        . hydrochlorothiazide (,MICROZIDE/HYDRODIURIL,) 12.5 MG capsule Take 12.5 mg by mouth daily.        . Misc Natural Products (OSTEO BI-FLEX ADV JOINT SHIELD PO) Take by mouth 2 (two) times daily.        . Multiple Vitamins-Minerals (OCUVITE-LUTEIN PO) Take by mouth daily.        . pimecrolimus (ELIDEL) 1 % cream Apply 1 application topically as needed.      . psyllium (METAMUCIL) 58.6 % powder Take 1 packet by mouth daily. Take 1 tsp daily       . tadalafil (CIALIS) 20 MG tablet Take 20 mg by mouth daily as needed.         Current Facility-Administered Medications  Medication Dose Route Frequency Provider Last Rate Last Dose  . 0.9 %  sodium chloride infusion  500 mL Intravenous Continuous Louis Meckel, MD         INSTRUCTIONS GIVEN AND DISCUSSED:   SPECIAL INSTRUCTIONS/FOLLOW-UP:  See above.  I acknowledge that I have been informed and understand all the instructions given to me and  received a copy. I do not have any more questions at this time, but understand that I may call the [facility] at [phone] during business hours should I have any further questions or need assistance in obtaining follow-up care.

## 2011-10-27 ENCOUNTER — Other Ambulatory Visit: Payer: Medicare Other | Admitting: Lab

## 2011-10-27 ENCOUNTER — Other Ambulatory Visit (HOSPITAL_COMMUNITY): Payer: Medicare Other

## 2011-11-01 ENCOUNTER — Ambulatory Visit: Payer: Medicare Other | Admitting: Hematology and Oncology

## 2012-06-01 ENCOUNTER — Telehealth: Payer: Self-pay | Admitting: Oncology

## 2012-06-01 IMAGING — CT CT ABD-PELV W/ CM
2 of 5 series · 17 of 46 positions shown, 19 images · IV contrast (Omnipaque 300)
Comparison: None

CLINICAL DATA: Malignant appearing sessile polyp seen in the
ascending colon on routine colonoscopy yesterday.

CT ABDOMEN AND PELVIS WITH CONTRAST
TECHNIQUE: Multidetector CT imaging of the abdomen and pelvis was
performed following the standard protocol during bolus
administration of intravenous contrast.
Contrast: 125 ml Hmnipaque-PNN

[Series 2: abd/ pel 5mm · axial · 0.83mm/px · z∈[-467,-52]mm · 14 of 93 slices shown, 16 images]
[im 5/93  soft-tissue]
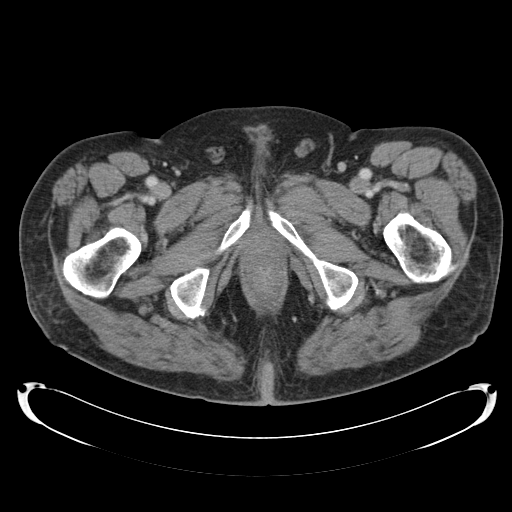
[im 5/93  bone]
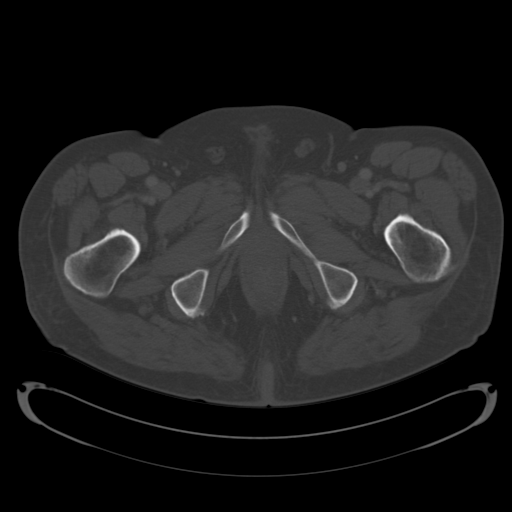
[im 10/93  soft-tissue]
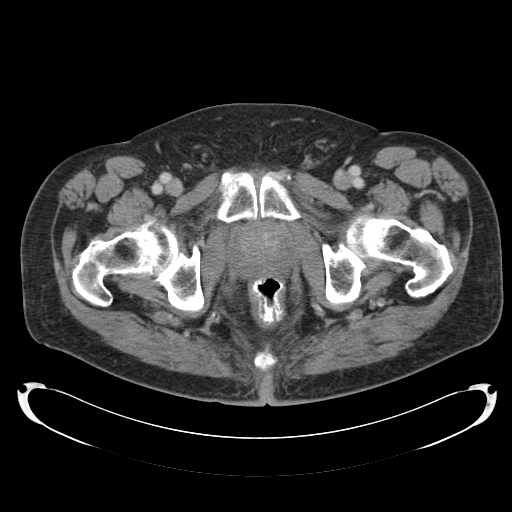
[im 20/93  soft-tissue]
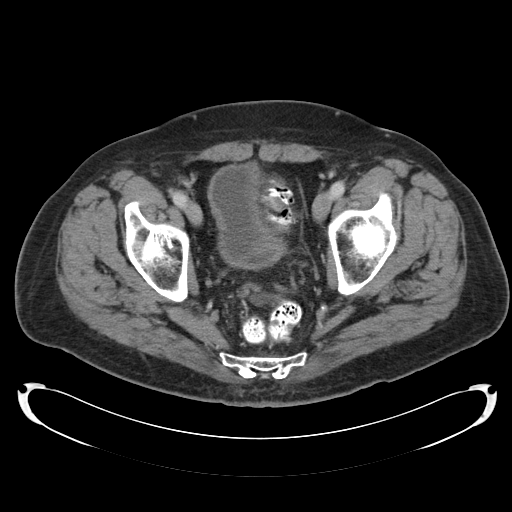
[im 25/93  soft-tissue]
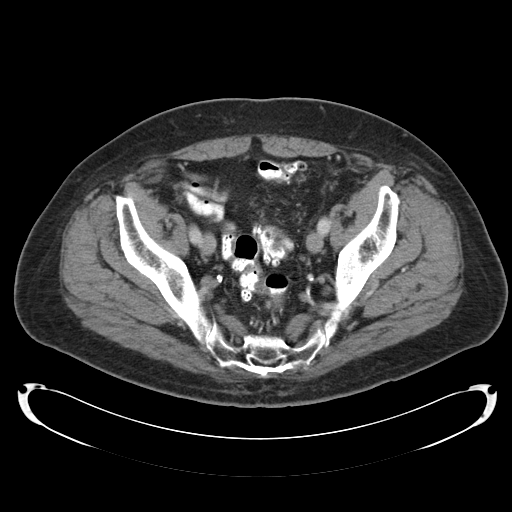
[im 30/93  soft-tissue]
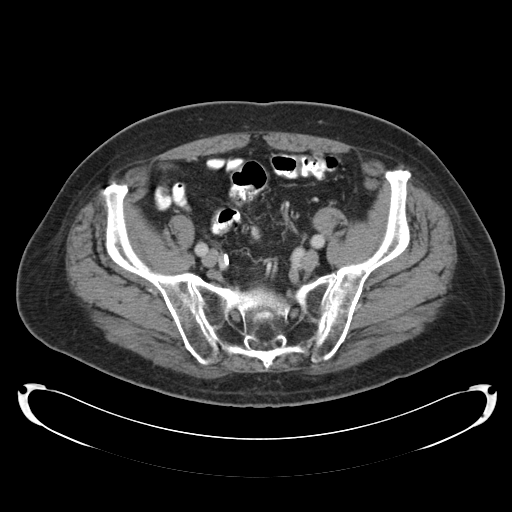
[im 39/93  soft-tissue]
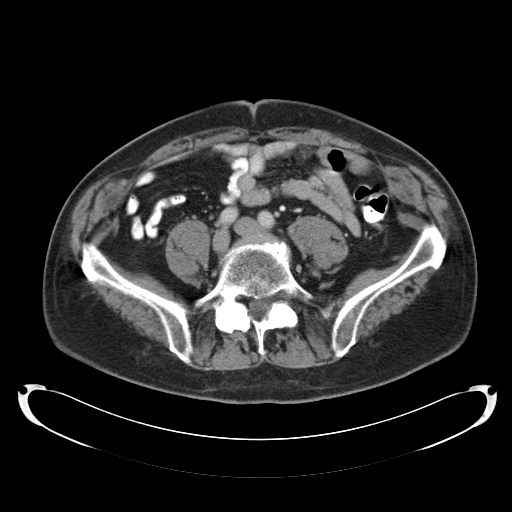
[im 44/93  soft-tissue]
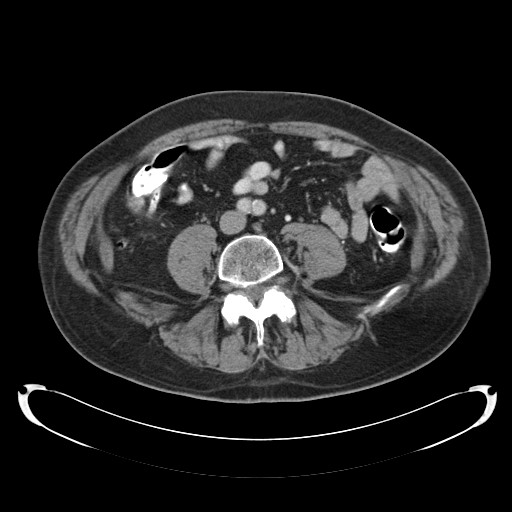
[im 49/93  soft-tissue]
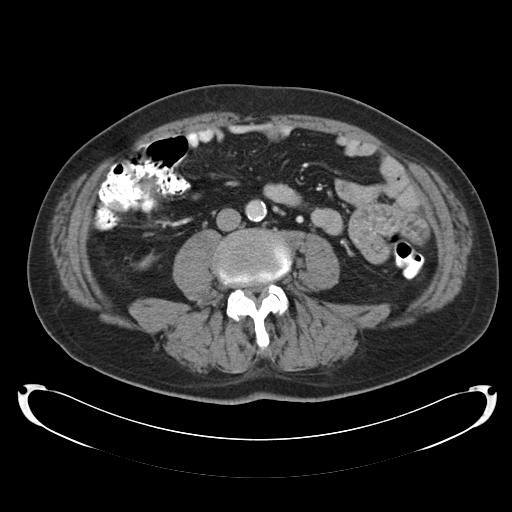
[im 54/93  soft-tissue]
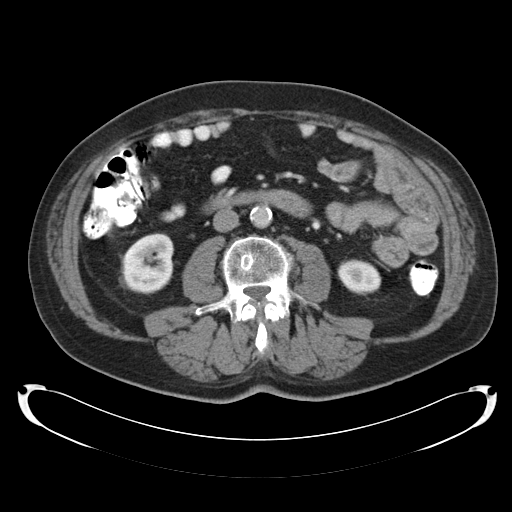
[im 54/93  bone]
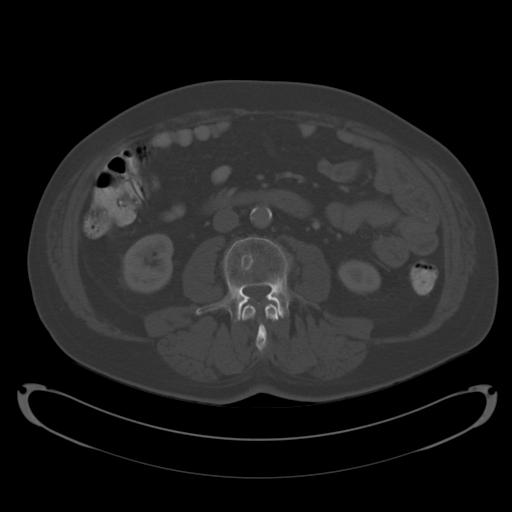
[im 63/93  soft-tissue]
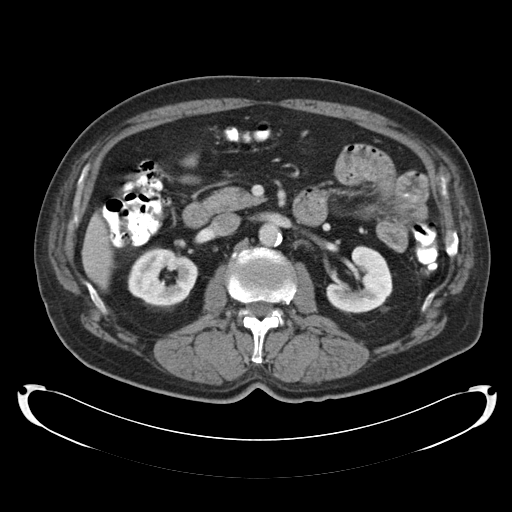
[im 68/93  soft-tissue]
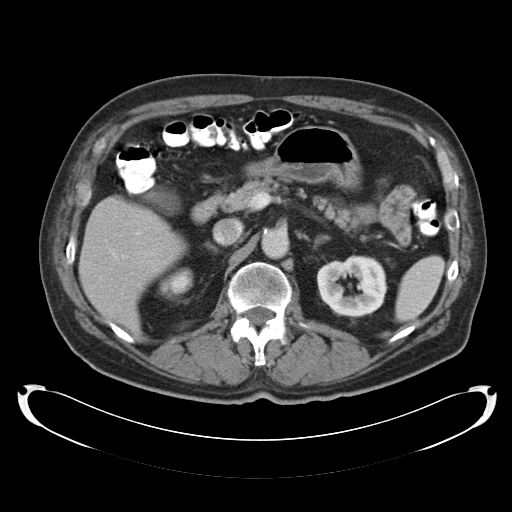
[im 73/93  soft-tissue]
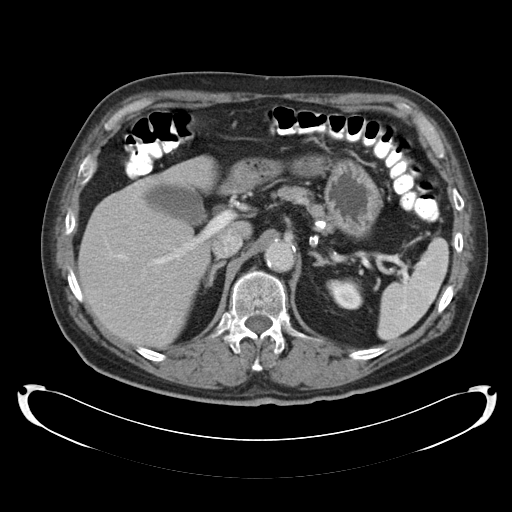
[im 83/93  soft-tissue]
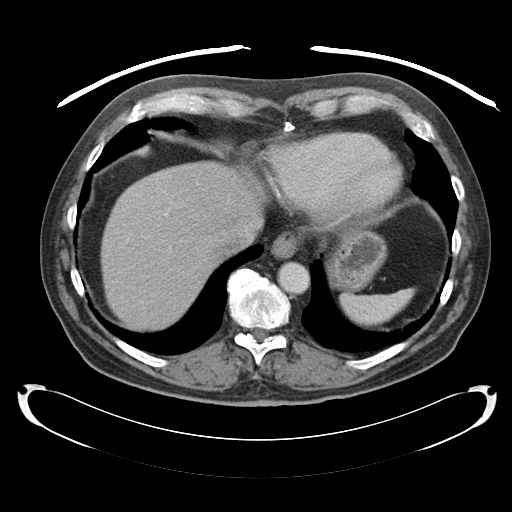
[im 88/93  soft-tissue]
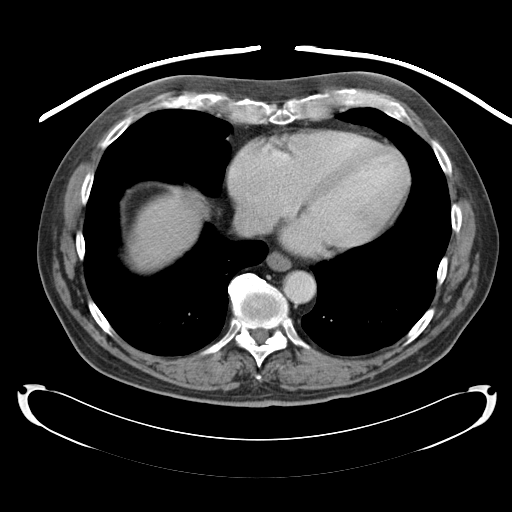

[Series 602: <mpr range> · coronal · 0.91mm/px · 3 of 122 slices shown]
[im 41/122  soft-tissue]
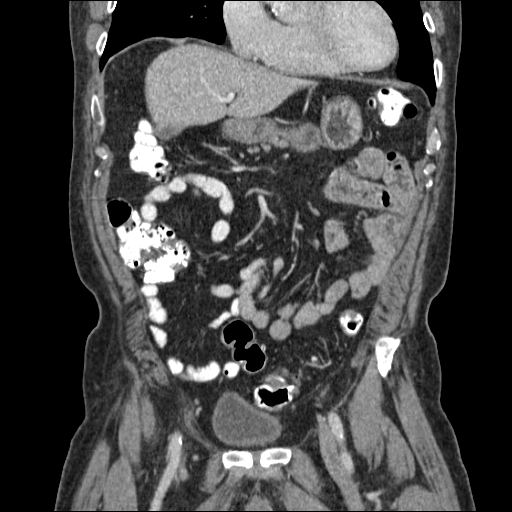
[im 54/122  soft-tissue]
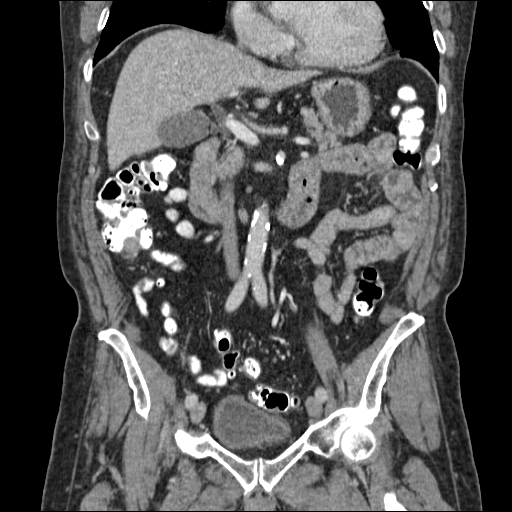
[im 68/122  soft-tissue]
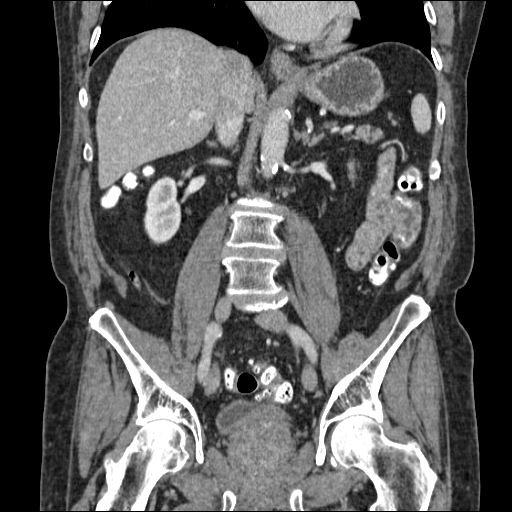

[17 of 46 positions shown; findings below may reference images not displayed]

FINDINGS: Images of the lung bases are unremarkable.  No focal
abnormalities identified within the liver, spleen, pancreas,
adrenal glands, or kidneys.  The gallbladder is present. Opacified
loops are normal in caliber and wall thickness.There are numerous
sigmoid diverticula.  No CT evidence for acute diverticulitis.  No
evidence for bowel obstruction.  There is atherosclerotic
calcification of the abdominal aorta.  No aneurysm.  Note is made
of prostatic enlargement.  There is a prominent impression upon the
posterior wall the bladder by the prostate.  Degenerative changes
are seen in the spine.
IMPRESSION: 1.  No evidence for metastatic disease of the abdomen.
2.  Diverticulosis.
3.  No evidence for obstruction from known ascending colonic polyp.
4.  Prostatic enlargement.

## 2012-06-01 NOTE — Telephone Encounter (Signed)
S/W pt in re provider change. Pt will see [facility] 2/12 @ 10:45.  Calendar mailed.  Dr. Lorina Rabon

## 2012-06-15 ENCOUNTER — Telehealth: Payer: Self-pay | Admitting: Oncology

## 2012-06-15 ENCOUNTER — Encounter: Payer: Self-pay | Admitting: Oncology

## 2012-06-15 NOTE — Telephone Encounter (Signed)
Pt has been talked to per chart.Marland KitchenMarland KitchenMarland KitchenMarland Kitchenprinted letter and appt schedule

## 2012-06-28 ENCOUNTER — Other Ambulatory Visit: Payer: Self-pay | Admitting: Family Medicine

## 2012-06-28 DIAGNOSIS — M1990 Unspecified osteoarthritis, unspecified site: Secondary | ICD-10-CM

## 2012-06-28 DIAGNOSIS — M545 Low back pain: Secondary | ICD-10-CM

## 2012-07-02 ENCOUNTER — Ambulatory Visit
Admission: RE | Admit: 2012-07-02 | Discharge: 2012-07-02 | Disposition: A | Discharge status: Home / Self Care | Payer: Medicare Other | Source: Ambulatory Visit | Attending: Family Medicine | Admitting: Family Medicine

## 2012-07-02 DIAGNOSIS — M1990 Unspecified osteoarthritis, unspecified site: Secondary | ICD-10-CM

## 2012-07-02 DIAGNOSIS — M545 Low back pain: Secondary | ICD-10-CM

## 2012-07-12 ENCOUNTER — Telehealth: Payer: Self-pay | Admitting: Oncology

## 2012-07-12 NOTE — Telephone Encounter (Signed)
pt needed to r/s appt due to surgery.Marland KitchenMarland KitchenDone

## 2012-07-16 ENCOUNTER — Other Ambulatory Visit (HOSPITAL_BASED_OUTPATIENT_CLINIC_OR_DEPARTMENT_OTHER): Payer: Medicare Other

## 2012-07-16 ENCOUNTER — Other Ambulatory Visit: Payer: Self-pay | Admitting: Neurosurgery

## 2012-07-16 DIAGNOSIS — C189 Malignant neoplasm of colon, unspecified: Secondary | ICD-10-CM

## 2012-07-16 LAB — COMPREHENSIVE METABOLIC PANEL (CC13)
Albumin: 3.5 g/dL (ref 3.5–5.0)
BUN: 13.7 mg/dL (ref 7.0–26.0)
CO2: 28 mEq/L (ref 22–29)
Calcium: 9.4 mg/dL (ref 8.4–10.4)
Chloride: 98 mEq/L (ref 98–107)
Creatinine: 1.1 mg/dL (ref 0.7–1.3)
Glucose: 176 mg/dl — ABNORMAL HIGH (ref 70–99)
Potassium: 3.3 mEq/L — ABNORMAL LOW (ref 3.5–5.1)

## 2012-07-16 LAB — CBC WITH DIFFERENTIAL/PLATELET
BASO%: 0.9 % (ref 0.0–2.0)
Basophils Absolute: 0.1 10*3/uL (ref 0.0–0.1)
Eosinophils Absolute: 0.1 10*3/uL (ref 0.0–0.5)
HCT: 39.4 % (ref 38.4–49.9)
HGB: 14 g/dL (ref 13.0–17.1)
LYMPH%: 12.9 % — ABNORMAL LOW (ref 14.0–49.0)
MCHC: 35.6 g/dL (ref 32.0–36.0)
MONO#: 0.5 10*3/uL (ref 0.1–0.9)
NEUT%: 76.5 % — ABNORMAL HIGH (ref 39.0–75.0)
Platelets: 219 10*3/uL (ref 140–400)
WBC: 6.5 10*3/uL (ref 4.0–10.3)
lymph#: 0.8 10*3/uL — ABNORMAL LOW (ref 0.9–3.3)

## 2012-07-17 ENCOUNTER — Encounter (HOSPITAL_COMMUNITY): Payer: Self-pay | Admitting: Pharmacy Technician

## 2012-07-17 ENCOUNTER — Other Ambulatory Visit: Payer: Self-pay | Admitting: Dermatology

## 2012-07-18 ENCOUNTER — Telehealth: Payer: Self-pay | Admitting: Oncology

## 2012-07-18 ENCOUNTER — Encounter: Payer: Self-pay | Admitting: Oncology

## 2012-07-18 ENCOUNTER — Ambulatory Visit (HOSPITAL_BASED_OUTPATIENT_CLINIC_OR_DEPARTMENT_OTHER): Payer: Medicare Other | Admitting: Oncology

## 2012-07-18 VITALS — BP 142/70 | HR 69 | Temp 96.9°F | Resp 22 | Ht 72.0 in | Wt 200.0 lb

## 2012-07-18 DIAGNOSIS — C189 Malignant neoplasm of colon, unspecified: Secondary | ICD-10-CM

## 2012-07-18 DIAGNOSIS — I10 Essential (primary) hypertension: Secondary | ICD-10-CM

## 2012-07-18 DIAGNOSIS — E785 Hyperlipidemia, unspecified: Secondary | ICD-10-CM

## 2012-07-18 NOTE — Telephone Encounter (Signed)
Gave pt appt for lab and MD on November 2014 and lab on May 2014 , gave pt oral contrast

## 2012-07-18 NOTE — Addendum Note (Signed)
Addended by: Myrtis Ser on: 07/18/2012 04:35 PM   Modules accepted: Orders

## 2012-07-18 NOTE — Progress Notes (Signed)
Hematology and Oncology Follow Up Visit  Ronnie Robinson [id number] Oct 23, 1931 79 y.o. 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  Prior Therapy: S/P laparoscopic-assisted right hemicolectomy on 12/16/09.  Current therapy: Watchful observation  Interim History:  [name] returns for routine follow-up by himself. He is due to have back surgery early next week. Denies abdominal pain, nausea, and vomiting. No change in his bowel habits. No bleeding noted. Appetite is good. He is gaining weight. No chest pain, shortness of breath, or dyspnea.   Medications: I have reviewed the patient's current medications. Current outpatient prescriptions:amLODipine (NORVASC) 5 MG tablet, Take 5 mg by mouth daily.  , Disp: , Rfl: ;  Ascorbic Acid (VITAMIN C WITH ROSE HIPS) 500 MG tablet, Take 500 mg by mouth daily. , Disp: , Rfl: ;  atorvastatin (LIPITOR) 40 MG tablet, Take 40 mg by mouth daily. , Disp: , Rfl: ;  doxazosin (CARDURA) 2 MG tablet, Take 2 mg by mouth at bedtime., Disp: , Rfl:  hydrALAZINE (APRESOLINE) 100 MG tablet, Take 100 mg by mouth 2 (two) times daily.  , Disp: , Rfl: ;  hydrochlorothiazide (HYDRODIURIL) 25 MG tablet, Take 25 mg by mouth daily., Disp: , Rfl: ;  Misc Natural Products (OSTEO BI-FLEX ADV JOINT SHIELD PO), Take 1 tablet by mouth 2 (two) times daily. , Disp: , Rfl: ;  Multiple Vitamin (MULTIVITAMIN WITH MINERALS) TABS, Take 1 tablet by mouth daily., Disp: , Rfl:  Multiple Vitamins-Minerals (OCUVITE-LUTEIN PO), Take 1 tablet by mouth daily. , Disp: , Rfl: ;  pimecrolimus (ELIDEL) 1 % cream, Apply 1 application topically as needed. For rosacea, Disp: , Rfl: ;  psyllium (METAMUCIL) 58.6 % powder, Take 1 packet by mouth daily. , Disp: , Rfl: ;  vitamin B-12 (CYANOCOBALAMIN) 100 MCG tablet, Take 100 mcg by mouth daily., Disp: , Rfl: ;  aspirin 325 MG tablet, Take 325 mg by mouth  daily.  , Disp: , Rfl:  fish oil-omega-3 fatty acids 1000 MG capsule, Take 3 g by mouth 2 (two) times daily. , Disp: , Rfl:   Allergies:  Allergies  Allergen Reactions  . Micardis (Telmisartan) Anaphylaxis    Swelling to head, face, and throat.    Past Medical History, Surgical history, Social history, and Family History were reviewed and updated.  Review of Systems: Constitutional:  Negative for fever, chills, night sweats, anorexia, weight loss, pain. Cardiovascular: no chest pain or dyspnea on exertion Respiratory: no cough, shortness of breath, or wheezing Neurological: no TIA or stroke symptoms Dermatological: negative ENT: negative Skin: Negative. Gastrointestinal: no abdominal pain, change in bowel habits, or black or bloody stools Genito-Urinary: no dysuria, trouble voiding, or hematuria Hematological and Lymphatic: negative Breast: negative for breast lumps Musculoskeletal: negative Remaining ROS negative.  Physical Exam: Blood pressure 142/70, pulse 69, temperature 96.9 F (36.1 C), temperature source Oral, resp. rate 22, height 6' (1.829 m), weight 200 lb (90.719 kg). ECOG:  General appearance: alert, cooperative and no distress Head: Normocephalic, without obvious abnormality, atraumatic Neck: no adenopathy, no carotid bruit, no JVD, supple, symmetrical, trachea midline and thyroid not enlarged, symmetric, no tenderness/mass/nodules Lymph nodes: Cervical, supraclavicular, and axillary nodes normal. Heart:regular rate and rhythm, S1, S2 normal, no murmur, click, rub or gallop Lung:chest clear, no wheezing, rales, normal symmetric air entry, no tachypnea, retractions or cyanosis Abdomen: soft, non-tender, without masses or organomegaly EXT:no erythema, induration, or nodules   Lab Results:  Lab Results  Component Value Date   WBC 6.5 07/16/2012   HGB 14.0 07/16/2012   HCT 39.4 07/16/2012   MCV 94.9 07/16/2012   PLT 219 07/16/2012     Chemistry      Component Value  Date/Time   NA 138 07/16/2012 1151   NA 138 10/23/2011 1138   NA 136 03/28/2011 1510   K 3.3* 07/16/2012 1151   K 4.0 10/23/2011 1138   K 3.6 03/28/2011 1510   CL 98 07/16/2012 1151   CL 97* 10/23/2011 1138   CL 98 03/28/2011 1510   CO2 28 07/16/2012 1151   CO2 27 10/23/2011 1138   CO2 27 03/28/2011 1510   BUN 13.7 07/16/2012 1151   BUN 16 10/23/2011 1138   BUN 19 03/28/2011 1510   CREATININE 1.1 07/16/2012 1151   CREATININE 0.8 10/23/2011 1138   CREATININE 1.00 03/28/2011 1510      Component Value Date/Time   CALCIUM 9.4 07/16/2012 1151   CALCIUM 8.9 10/23/2011 1138   CALCIUM 9.4 03/28/2011 1510   ALKPHOS 67 07/16/2012 1151   ALKPHOS 56 10/23/2011 1138   ALKPHOS 67 03/28/2011 1510   AST 25 07/16/2012 1151   AST 33 10/23/2011 1138   AST 28 03/28/2011 1510   ALT 28 07/16/2012 1151   ALT 29 03/28/2011 1510   BILITOT 1.92* 07/16/2012 1151   BILITOT 1.50 10/23/2011 1138   BILITOT 1.1 03/28/2011 1510     Impression and Plan: This is a 79 year old gentleman with the following issues:  1. Stage IIA colon cancer diagnosed in 2011. S/P laparoscopic-assisted hemicolectomy of the right colon. Discussed with patient that based on history, physical exam, and labs he has no evidence to suggest recurrence. He will be due for annual surveillance CT scans this May and I have scheduled these today. Next colonoscopy is due in June 2015.  2. HTN. On Norvasc, HCTZ, and Hydralazine per PCP.  3. Hyperlipidemia. On Lipitor per PCP.  4. Follow-up. CT in 3 months. Lab and visit in 9 months.   Case reviewed with Dr Clelia Croft. Spent more than half the time coordinating care.    Clenton Pare 2/6/20144:21 PM

## 2012-07-19 ENCOUNTER — Ambulatory Visit (HOSPITAL_COMMUNITY)
Admission: RE | Admit: 2012-07-19 | Discharge: 2012-07-19 | Disposition: A | Discharge status: Home / Self Care | Payer: Medicare Other | Source: Ambulatory Visit | Attending: Anesthesiology | Admitting: Anesthesiology

## 2012-07-19 ENCOUNTER — Encounter (HOSPITAL_COMMUNITY): Payer: Self-pay

## 2012-07-19 ENCOUNTER — Encounter (HOSPITAL_COMMUNITY)
Admission: RE | Admit: 2012-07-19 | Discharge: 2012-07-19 | Disposition: A | Discharge status: Home / Self Care | Payer: Medicare Other | Source: Ambulatory Visit | Attending: Neurosurgery | Admitting: Neurosurgery

## 2012-07-19 DIAGNOSIS — Z01818 Encounter for other preprocedural examination: Secondary | ICD-10-CM | POA: Insufficient documentation

## 2012-07-19 DIAGNOSIS — Z01812 Encounter for preprocedural laboratory examination: Secondary | ICD-10-CM | POA: Insufficient documentation

## 2012-07-19 DIAGNOSIS — I440 Atrioventricular block, first degree: Secondary | ICD-10-CM | POA: Insufficient documentation

## 2012-07-19 DIAGNOSIS — M47814 Spondylosis without myelopathy or radiculopathy, thoracic region: Secondary | ICD-10-CM | POA: Insufficient documentation

## 2012-07-19 DIAGNOSIS — Z01810 Encounter for preprocedural cardiovascular examination: Secondary | ICD-10-CM | POA: Insufficient documentation

## 2012-07-19 HISTORY — DX: Unspecified osteoarthritis, unspecified site: M19.90

## 2012-07-19 LAB — CBC WITH DIFFERENTIAL/PLATELET
Basophils Absolute: 0 10*3/uL (ref 0.0–0.1)
Eosinophils Absolute: 0.1 10*3/uL (ref 0.0–0.7)
Eosinophils Relative: 1 % (ref 0–5)
MCH: 34.2 pg — ABNORMAL HIGH (ref 26.0–34.0)
MCHC: 36.7 g/dL — ABNORMAL HIGH (ref 30.0–36.0)
MCV: 93.2 fL (ref 78.0–100.0)
Platelets: 200 10*3/uL (ref 150–400)
RDW: 13 % (ref 11.5–15.5)

## 2012-07-19 LAB — TYPE AND SCREEN
ABO/RH(D): A POS
Antibody Screen: NEGATIVE

## 2012-07-19 LAB — SURGICAL PCR SCREEN
MRSA, PCR: NEGATIVE
Staphylococcus aureus: NEGATIVE

## 2012-07-19 LAB — BASIC METABOLIC PANEL
CO2: 30 mEq/L (ref 19–32)
Calcium: 9.8 mg/dL (ref 8.4–10.5)
Creatinine, Ser: 0.89 mg/dL (ref 0.50–1.35)
Glucose, Bld: 97 mg/dL (ref 70–99)

## 2012-07-19 LAB — URINALYSIS, ROUTINE W REFLEX MICROSCOPIC
Ketones, ur: NEGATIVE mg/dL
Nitrite: NEGATIVE
Protein, ur: NEGATIVE mg/dL

## 2012-07-19 LAB — URINE MICROSCOPIC-ADD ON

## 2012-07-19 NOTE — Pre-Procedure Instructions (Addendum)
Ronnie Robinson  07/19/2012   Your procedure is scheduled on:  07/22/12  Report to [facility] at 530AM.  Call this number if you have problems the morning of surgery: [phone]   Remember:   Do not eat food or drink liquids after midnight.   Take these medicines the morning of surgery with A SIP OF WATER: doxazosin, hydralazine,amlodipine  STOP multi vit, aspirin, fish oil ,any nsaids, herbal meds, or blood thinners now   Do not wear jewelry, make-up or nail polish.  Do not wear lotions, powders, or perfumes. You may wear deodorant.  Do not shave 48 hours prior to surgery. Men may shave face and neck.  Do not bring valuables to the hospital.  Contacts, dentures or bridgework may not be worn into surgery.  Leave suitcase in the car. After surgery it may be brought to your room.  For patients admitted to the hospital, checkout time is 11:00 AM the day of  discharge.   Patients discharged the day of surgery will not be allowed to drive  home.  Name and phone number of your driver:   Special Instructions: Shower using CHG 2 nights before surgery and the night before surgery.  If you shower the day of surgery use CHG.  Use special wash - you have one bottle of CHG for all showers.  You should use approximately 1/3 of the bottle for each shower.   Please read over the following fact sheets that you were given: Pain Booklet, Coughing and Deep Breathing, Blood Transfusion Information, MRSA Information and Surgical Site Infection Prevention

## 2012-07-21 MED ORDER — CEFAZOLIN SODIUM-DEXTROSE 2-3 GM-% IV SOLR
2.0000 g | INTRAVENOUS | Status: AC
  Administered 2012-07-22: 2 g via INTRAVENOUS
  Filled 2012-07-21: qty 50

## 2012-07-22 ENCOUNTER — Inpatient Hospital Stay (HOSPITAL_COMMUNITY)
Admission: RE | Admit: 2012-07-22 | Discharge: 2012-07-26 | DRG: 460 | Disposition: A | Discharge status: Home Health | Payer: Medicare Other | Source: Ambulatory Visit | Attending: Neurosurgery | Admitting: Neurosurgery

## 2012-07-22 ENCOUNTER — Encounter (HOSPITAL_COMMUNITY): Payer: Self-pay | Admitting: Certified Registered Nurse Anesthetist

## 2012-07-22 ENCOUNTER — Encounter (HOSPITAL_COMMUNITY)
Admission: RE | Disposition: A | Discharge status: Home Health | Payer: Self-pay | Source: Ambulatory Visit | Attending: Neurosurgery

## 2012-07-22 ENCOUNTER — Inpatient Hospital Stay (HOSPITAL_COMMUNITY): Payer: Medicare Other

## 2012-07-22 ENCOUNTER — Other Ambulatory Visit: Payer: Medicare Other | Admitting: Lab

## 2012-07-22 ENCOUNTER — Inpatient Hospital Stay (HOSPITAL_COMMUNITY): Payer: Medicare Other | Admitting: Certified Registered Nurse Anesthetist

## 2012-07-22 DIAGNOSIS — I10 Essential (primary) hypertension: Secondary | ICD-10-CM | POA: Diagnosis present

## 2012-07-22 DIAGNOSIS — M129 Arthropathy, unspecified: Secondary | ICD-10-CM | POA: Diagnosis present

## 2012-07-22 DIAGNOSIS — Q762 Congenital spondylolisthesis: Secondary | ICD-10-CM

## 2012-07-22 DIAGNOSIS — M4716 Other spondylosis with myelopathy, lumbar region: Principal | ICD-10-CM | POA: Diagnosis present

## 2012-07-22 DIAGNOSIS — M5106 Intervertebral disc disorders with myelopathy, lumbar region: Secondary | ICD-10-CM | POA: Diagnosis present

## 2012-07-22 DIAGNOSIS — Z87891 Personal history of nicotine dependence: Secondary | ICD-10-CM

## 2012-07-22 DIAGNOSIS — M48062 Spinal stenosis, lumbar region with neurogenic claudication: Secondary | ICD-10-CM

## 2012-07-22 HISTORY — PX: POSTERIOR FUSION LUMBAR SPINE: SUR632

## 2012-07-22 LAB — PROTIME-INR
INR: 0.95 (ref 0.00–1.49)
Prothrombin Time: 12.6 seconds (ref 11.6–15.2)

## 2012-07-22 SURGERY — POSTERIOR LUMBAR FUSION 2 LEVEL
Anesthesia: General | Site: Back | Wound class: Clean

## 2012-07-22 MED ORDER — BISACODYL 10 MG RE SUPP: 10.0000 mg | Freq: Every day | RECTAL | Status: DC | PRN

## 2012-07-22 MED ORDER — SODIUM CHLORIDE 0.9 % IV SOLN
INTRAVENOUS | Status: AC
  Administered 2012-07-22: 1000 mL
  Filled 2012-07-22: qty 500

## 2012-07-22 MED ORDER — CYCLOBENZAPRINE HCL 10 MG PO TABS
10.0000 mg | ORAL_TABLET | Freq: Three times a day (TID) | ORAL | Status: DC | PRN
  Administered 2012-07-22 – 2012-07-25 (×4): 10 mg via ORAL
  Filled 2012-07-22 (×4): qty 1

## 2012-07-22 MED ORDER — PIMECROLIMUS 1 % EX CREA: 1.0000 "application " | TOPICAL_CREAM | CUTANEOUS | Status: DC | PRN

## 2012-07-22 MED ORDER — MAGNESIUM HYDROXIDE 400 MG/5ML PO SUSP: 30.0000 mL | Freq: Every day | ORAL | Status: DC | PRN

## 2012-07-22 MED ORDER — NALOXONE HCL 0.4 MG/ML IJ SOLN: 0.4000 mg | INTRAMUSCULAR | Status: DC | PRN

## 2012-07-22 MED ORDER — AMLODIPINE BESYLATE 5 MG PO TABS
5.0000 mg | ORAL_TABLET | Freq: Every day | ORAL | Status: DC
  Administered 2012-07-22 – 2012-07-26 (×5): 5 mg via ORAL
  Filled 2012-07-22 (×5): qty 1

## 2012-07-22 MED ORDER — SODIUM CHLORIDE 0.9 % IV SOLN
10.0000 mg | INTRAVENOUS | Status: DC | PRN
  Administered 2012-07-22: 11:00:00 via INTRAVENOUS
  Administered 2012-07-22: 35 ug/min via INTRAVENOUS

## 2012-07-22 MED ORDER — ONDANSETRON HCL 4 MG/2ML IJ SOLN
INTRAMUSCULAR | Status: DC | PRN
  Administered 2012-07-22: 4 mg via INTRAVENOUS

## 2012-07-22 MED ORDER — PROPOFOL 10 MG/ML IV BOLUS
INTRAVENOUS | Status: DC | PRN
  Administered 2012-07-22: 180 mg via INTRAVENOUS

## 2012-07-22 MED ORDER — SODIUM CHLORIDE 0.9 % IJ SOLN: 9.0000 mL | INTRAMUSCULAR | Status: DC | PRN

## 2012-07-22 MED ORDER — HYDRALAZINE HCL 50 MG PO TABS
100.0000 mg | ORAL_TABLET | Freq: Two times a day (BID) | ORAL | Status: DC
  Administered 2012-07-23 – 2012-07-26 (×6): 100 mg via ORAL
  Filled 2012-07-22 (×9): qty 2

## 2012-07-22 MED ORDER — OCUVITE-LUTEIN PO CAPS
1.0000 | ORAL_CAPSULE | Freq: Every day | ORAL | Status: DC
  Administered 2012-07-23 – 2012-07-26 (×4): 1 via ORAL
  Filled 2012-07-22 (×5): qty 1

## 2012-07-22 MED ORDER — VITAMIN B-12 100 MCG PO TABS
100.0000 ug | ORAL_TABLET | Freq: Every day | ORAL | Status: DC
  Administered 2012-07-23 – 2012-07-26 (×4): 100 ug via ORAL
  Filled 2012-07-22 (×5): qty 1

## 2012-07-22 MED ORDER — ONDANSETRON HCL 4 MG/2ML IJ SOLN: 4.0000 mg | Freq: Four times a day (QID) | INTRAMUSCULAR | Status: DC | PRN

## 2012-07-22 MED ORDER — KETOROLAC TROMETHAMINE 30 MG/ML IJ SOLN
15.0000 mg | Freq: Four times a day (QID) | INTRAMUSCULAR | Status: AC
  Administered 2012-07-22 – 2012-07-23 (×4): 15 mg via INTRAVENOUS
  Filled 2012-07-22 (×2): qty 1
  Filled 2012-07-22: qty 2
  Filled 2012-07-22 (×3): qty 1

## 2012-07-22 MED ORDER — ARTIFICIAL TEARS OP OINT
TOPICAL_OINTMENT | OPHTHALMIC | Status: DC | PRN
  Administered 2012-07-22: 1 via OPHTHALMIC

## 2012-07-22 MED ORDER — DOXAZOSIN MESYLATE 2 MG PO TABS
2.0000 mg | ORAL_TABLET | Freq: Every day | ORAL | Status: DC
  Administered 2012-07-22 – 2012-07-25 (×3): 2 mg via ORAL
  Filled 2012-07-22 (×5): qty 1

## 2012-07-22 MED ORDER — SODIUM CHLORIDE 0.9 % IR SOLN
Status: DC | PRN
  Administered 2012-07-22: 08:00:00

## 2012-07-22 MED ORDER — MIDAZOLAM HCL 5 MG/5ML IJ SOLN
INTRAMUSCULAR | Status: DC | PRN
  Administered 2012-07-22: 2 mg via INTRAVENOUS

## 2012-07-22 MED ORDER — ATORVASTATIN CALCIUM 40 MG PO TABS
40.0000 mg | ORAL_TABLET | Freq: Every day | ORAL | Status: DC
  Administered 2012-07-22 – 2012-07-25 (×4): 40 mg via ORAL
  Filled 2012-07-22 (×6): qty 1

## 2012-07-22 MED ORDER — HYDROMORPHONE HCL PF 1 MG/ML IJ SOLN: 0.2500 mg | INTRAMUSCULAR | Status: DC | PRN

## 2012-07-22 MED ORDER — PSYLLIUM 95 % PO PACK
1.0000 | PACK | Freq: Every day | ORAL | Status: DC
  Administered 2012-07-22 – 2012-07-26 (×5): 1 via ORAL
  Filled 2012-07-22 (×5): qty 1

## 2012-07-22 MED ORDER — GLYCOPYRROLATE 0.2 MG/ML IJ SOLN
INTRAMUSCULAR | Status: DC | PRN
  Administered 2012-07-22: .6 mg via INTRAVENOUS
  Administered 2012-07-22: 0.2 mg via INTRAVENOUS

## 2012-07-22 MED ORDER — LACTATED RINGERS IV SOLN: INTRAVENOUS | Status: DC

## 2012-07-22 MED ORDER — DEXTROSE 5 % IV SOLN
500.0000 mg | Freq: Four times a day (QID) | INTRAVENOUS | Status: DC | PRN
  Filled 2012-07-22: qty 5

## 2012-07-22 MED ORDER — MORPHINE SULFATE (PF) 1 MG/ML IV SOLN: INTRAVENOUS | Status: DC

## 2012-07-22 MED ORDER — OXYCODONE HCL 5 MG PO TABS: 5.0000 mg | ORAL_TABLET | Freq: Once | ORAL | Status: DC | PRN

## 2012-07-22 MED ORDER — PROMETHAZINE HCL 25 MG PO TABS: 12.5000 mg | ORAL_TABLET | ORAL | Status: DC | PRN

## 2012-07-22 MED ORDER — SODIUM CHLORIDE 0.9 % IJ SOLN
3.0000 mL | Freq: Two times a day (BID) | INTRAMUSCULAR | Status: DC
  Administered 2012-07-22 – 2012-07-25 (×4): 3 mL via INTRAVENOUS

## 2012-07-22 MED ORDER — BACITRACIN 50000 UNITS IM SOLR
INTRAMUSCULAR | Status: AC
  Filled 2012-07-22: qty 1

## 2012-07-22 MED ORDER — ADULT MULTIVITAMIN W/MINERALS CH
1.0000 | ORAL_TABLET | Freq: Every day | ORAL | Status: DC
  Administered 2012-07-22 – 2012-07-25 (×5): 1 via ORAL
  Administered 2012-07-26: 10:00:00 via ORAL
  Filled 2012-07-22 (×5): qty 1

## 2012-07-22 MED ORDER — ALBUMIN HUMAN 5 % IV SOLN
INTRAVENOUS | Status: DC | PRN
  Administered 2012-07-22 (×2): via INTRAVENOUS

## 2012-07-22 MED ORDER — EPHEDRINE SULFATE 50 MG/ML IJ SOLN
INTRAMUSCULAR | Status: DC | PRN
  Administered 2012-07-22 (×3): 5 mg via INTRAVENOUS
  Administered 2012-07-22: 10 mg via INTRAVENOUS

## 2012-07-22 MED ORDER — 0.9 % SODIUM CHLORIDE (POUR BTL) OPTIME
TOPICAL | Status: DC | PRN
  Administered 2012-07-22: 1000 mL

## 2012-07-22 MED ORDER — MORPHINE SULFATE (PF) 1 MG/ML IV SOLN
INTRAVENOUS | Status: AC
  Administered 2012-07-22: 12:00:00
  Filled 2012-07-22: qty 25

## 2012-07-22 MED ORDER — SURGIFOAM 100 EX MISC
CUTANEOUS | Status: DC | PRN
  Administered 2012-07-22: 08:00:00 via TOPICAL

## 2012-07-22 MED ORDER — VECURONIUM BROMIDE 10 MG IV SOLR
INTRAVENOUS | Status: DC | PRN
  Administered 2012-07-22: 3 mg via INTRAVENOUS
  Administered 2012-07-22: 1 mg via INTRAVENOUS

## 2012-07-22 MED ORDER — LACTATED RINGERS IV SOLN
INTRAVENOUS | Status: DC | PRN
  Administered 2012-07-22 (×3): via INTRAVENOUS

## 2012-07-22 MED ORDER — ONDANSETRON HCL 4 MG/2ML IJ SOLN: 4.0000 mg | INTRAMUSCULAR | Status: DC | PRN

## 2012-07-22 MED ORDER — MENTHOL 3 MG MT LOZG: 1.0000 | LOZENGE | OROMUCOSAL | Status: DC | PRN

## 2012-07-22 MED ORDER — NEOSTIGMINE METHYLSULFATE 1 MG/ML IJ SOLN
INTRAMUSCULAR | Status: DC | PRN
  Administered 2012-07-22: 4 mg via INTRAVENOUS

## 2012-07-22 MED ORDER — DIPHENHYDRAMINE HCL 12.5 MG/5ML PO ELIX: 12.5000 mg | ORAL_SOLUTION | Freq: Four times a day (QID) | ORAL | Status: DC | PRN

## 2012-07-22 MED ORDER — LIDOCAINE HCL (CARDIAC) 20 MG/ML IV SOLN
INTRAVENOUS | Status: DC | PRN
  Administered 2012-07-22: 65 mg via INTRAVENOUS

## 2012-07-22 MED ORDER — PHENYLEPHRINE HCL 10 MG/ML IJ SOLN
INTRAMUSCULAR | Status: DC | PRN
  Administered 2012-07-22 (×4): 80 ug via INTRAVENOUS

## 2012-07-22 MED ORDER — CEFAZOLIN SODIUM 1-5 GM-% IV SOLN
1.0000 g | Freq: Three times a day (TID) | INTRAVENOUS | Status: AC
  Administered 2012-07-22 (×2): 1 g via INTRAVENOUS
  Filled 2012-07-22 (×2): qty 50

## 2012-07-22 MED ORDER — LIDOCAINE HCL 4 % MT SOLN
OROMUCOSAL | Status: DC | PRN
  Administered 2012-07-22: 4 mL via TOPICAL

## 2012-07-22 MED ORDER — SODIUM CHLORIDE 0.9 % IJ SOLN
3.0000 mL | INTRAMUSCULAR | Status: DC | PRN
  Administered 2012-07-22 – 2012-07-25 (×2): 3 mL via INTRAVENOUS

## 2012-07-22 MED ORDER — PROMETHAZINE HCL 25 MG/ML IJ SOLN
12.5000 mg | INTRAMUSCULAR | Status: DC | PRN
  Filled 2012-07-22: qty 1

## 2012-07-22 MED ORDER — LIDOCAINE-EPINEPHRINE 1 %-1:100000 IJ SOLN
INTRAMUSCULAR | Status: DC | PRN
  Administered 2012-07-22: 20 mL

## 2012-07-22 MED ORDER — DIPHENHYDRAMINE HCL 50 MG/ML IJ SOLN: 12.5000 mg | Freq: Four times a day (QID) | INTRAMUSCULAR | Status: DC | PRN

## 2012-07-22 MED ORDER — HYDRALAZINE HCL 100 MG PO TABS: 100.0000 mg | ORAL_TABLET | Freq: Two times a day (BID) | ORAL | Status: DC

## 2012-07-22 MED ORDER — ZOLPIDEM TARTRATE 5 MG PO TABS: 5.0000 mg | ORAL_TABLET | Freq: Every evening | ORAL | Status: DC | PRN

## 2012-07-22 MED ORDER — FENTANYL CITRATE 0.05 MG/ML IJ SOLN
INTRAMUSCULAR | Status: DC | PRN
  Administered 2012-07-22: 25 ug via INTRAVENOUS
  Administered 2012-07-22: 100 ug via INTRAVENOUS
  Administered 2012-07-22: 25 ug via INTRAVENOUS
  Administered 2012-07-22: 100 ug via INTRAVENOUS
  Administered 2012-07-22: 25 ug via INTRAVENOUS

## 2012-07-22 MED ORDER — OXYCODONE HCL 5 MG/5ML PO SOLN: 5.0000 mg | Freq: Once | ORAL | Status: DC | PRN

## 2012-07-22 MED ORDER — HYDROCHLOROTHIAZIDE 25 MG PO TABS
25.0000 mg | ORAL_TABLET | Freq: Every day | ORAL | Status: DC
  Administered 2012-07-22 – 2012-07-26 (×5): 25 mg via ORAL
  Filled 2012-07-22 (×5): qty 1

## 2012-07-22 MED ORDER — SODIUM CHLORIDE 0.9 % IV SOLN
INTRAVENOUS | Status: DC | PRN
  Administered 2012-07-22 (×2): via INTRAVENOUS

## 2012-07-22 MED ORDER — METHOCARBAMOL 500 MG PO TABS: 500.0000 mg | ORAL_TABLET | Freq: Four times a day (QID) | ORAL | Status: DC | PRN

## 2012-07-22 MED ORDER — DOCUSATE SODIUM 100 MG PO CAPS
100.0000 mg | ORAL_CAPSULE | Freq: Two times a day (BID) | ORAL | Status: DC
  Administered 2012-07-22 – 2012-07-25 (×7): 100 mg via ORAL
  Filled 2012-07-22 (×6): qty 1

## 2012-07-22 MED ORDER — ACETAMINOPHEN 325 MG PO TABS
650.0000 mg | ORAL_TABLET | ORAL | Status: DC | PRN
  Administered 2012-07-22 – 2012-07-24 (×4): 650 mg via ORAL
  Filled 2012-07-22 (×4): qty 2

## 2012-07-22 MED ORDER — SODIUM CHLORIDE 0.9 % IV SOLN: 250.0000 mL | INTRAVENOUS | Status: DC

## 2012-07-22 MED ORDER — ROCURONIUM BROMIDE 100 MG/10ML IV SOLN
INTRAVENOUS | Status: DC | PRN
  Administered 2012-07-22: 50 mg via INTRAVENOUS

## 2012-07-22 MED ORDER — ACETAMINOPHEN 650 MG RE SUPP: 650.0000 mg | RECTAL | Status: DC | PRN

## 2012-07-22 SURGICAL SUPPLY — 68 items
APL SKNCLS STERI-STRIP NONHPOA (GAUZE/BANDAGES/DRESSINGS) ×2
BAG DECANTER FOR FLEXI CONT (MISCELLANEOUS) ×2 IMPLANT
BENZOIN TINCTURE PRP APPL 2/3 (GAUZE/BANDAGES/DRESSINGS) ×2 IMPLANT
BLADE SURG ROTATE 9660 (MISCELLANEOUS) IMPLANT
BONE VOID FILLER STRIP 10CC (Bone Implant) ×1 IMPLANT
BUR PRECISION FLUTE 5.0 (BURR) ×2 IMPLANT
CAGE CONCORDE BULLET 9X10X23 (Cage) ×1 IMPLANT
CAGE CONCORDE BULLET 9X11X23 (Cage) ×2 IMPLANT
CAGE SPNL PRLL BLT NOSE 23X9 (Cage) IMPLANT
CANISTER SUCTION 2500CC (MISCELLANEOUS) ×2 IMPLANT
CLOTH BEACON ORANGE TIMEOUT ST (SAFETY) ×2 IMPLANT
CONT SPEC 4OZ CLIKSEAL STRL BL (MISCELLANEOUS) ×4 IMPLANT
COVER BACK TABLE 24X17X13 BIG (DRAPES) IMPLANT
COVER TABLE BACK 60X90 (DRAPES) ×2 IMPLANT
DRAPE C-ARM 42X72 X-RAY (DRAPES) ×4 IMPLANT
DRAPE LAPAROTOMY 100X72X124 (DRAPES) ×2 IMPLANT
DRAPE POUCH INSTRU U-SHP 10X18 (DRAPES) ×2 IMPLANT
DRAPE PROXIMA HALF (DRAPES) ×2 IMPLANT
DRAPE SURG 17X23 STRL (DRAPES) ×2 IMPLANT
DRESSING TELFA 8X3 (GAUZE/BANDAGES/DRESSINGS) IMPLANT
DURAPREP 26ML APPLICATOR (WOUND CARE) ×2 IMPLANT
ELECT REM PT RETURN 9FT ADLT (ELECTROSURGICAL) ×2
ELECTRODE REM PT RTRN 9FT ADLT (ELECTROSURGICAL) ×1 IMPLANT
GAUZE SPONGE 4X4 16PLY XRAY LF (GAUZE/BANDAGES/DRESSINGS) ×1 IMPLANT
GLOVE ECLIPSE 7.5 STRL STRAW (GLOVE) ×4 IMPLANT
GLOVE ECLIPSE 8.5 STRL (GLOVE) ×1 IMPLANT
GLOVE EXAM NITRILE LRG STRL (GLOVE) IMPLANT
GLOVE EXAM NITRILE MD LF STRL (GLOVE) IMPLANT
GLOVE EXAM NITRILE XL STR (GLOVE) IMPLANT
GLOVE EXAM NITRILE XS STR PU (GLOVE) IMPLANT
GLOVE INDICATOR 7.0 STRL GRN (GLOVE) ×4 IMPLANT
GLOVE INDICATOR 8.5 STRL (GLOVE) ×1 IMPLANT
GLOVE SURG SS PI 7.0 STRL IVOR (GLOVE) ×3 IMPLANT
GLOVE SURG SS PI 8.0 STRL IVOR (GLOVE) ×1 IMPLANT
GOWN BRE IMP SLV AUR LG STRL (GOWN DISPOSABLE) ×1 IMPLANT
GOWN BRE IMP SLV AUR XL STRL (GOWN DISPOSABLE) ×4 IMPLANT
GOWN STRL REIN 2XL LVL4 (GOWN DISPOSABLE) ×1 IMPLANT
KIT BASIN OR (CUSTOM PROCEDURE TRAY) ×2 IMPLANT
KIT ROOM TURNOVER OR (KITS) ×2 IMPLANT
MILL MEDIUM DISP (BLADE) ×2 IMPLANT
NEEDLE HYPO 22GX1.5 SAFETY (NEEDLE) ×3 IMPLANT
NS IRRIG 1000ML POUR BTL (IV SOLUTION) ×2 IMPLANT
PACK LAMINECTOMY NEURO (CUSTOM PROCEDURE TRAY) ×2 IMPLANT
PAD ARMBOARD 7.5X6 YLW CONV (MISCELLANEOUS) ×6 IMPLANT
PATTIES SURGICAL .5 X.5 (GAUZE/BANDAGES/DRESSINGS) ×1 IMPLANT
PATTIES SURGICAL .5 X1 (DISPOSABLE) IMPLANT
PATTIES SURGICAL .75X.75 (GAUZE/BANDAGES/DRESSINGS) ×2 IMPLANT
PATTIES SURGICAL 1X1 (DISPOSABLE) ×1 IMPLANT
PUTTY BONE DBX 5CC MIX (Putty) ×1 IMPLANT
ROD EXPEDIUM PREBENT 5.5 65MM (Rod) ×2 IMPLANT
SCREW EXPEDIUM POLY 6X55MM (Screw) ×2 IMPLANT
SCREW EXPEDIUM POLYAXIAL 6X50M (Screw) ×4 IMPLANT
SCREW SET SINGLE INNER (Screw) ×6 IMPLANT
SPONGE GAUZE 4X4 12PLY (GAUZE/BANDAGES/DRESSINGS) ×2 IMPLANT
SPONGE LAP 4X18 X RAY DECT (DISPOSABLE) ×1 IMPLANT
SPONGE SURGIFOAM ABS GEL 100 (HEMOSTASIS) ×1 IMPLANT
STRIP CLOSURE SKIN 1/2X4 (GAUZE/BANDAGES/DRESSINGS) ×2 IMPLANT
SUT VIC AB 0 CT1 18XCR BRD8 (SUTURE) ×2 IMPLANT
SUT VIC AB 0 CT1 8-18 (SUTURE) ×4
SUT VIC AB 2-0 CP2 18 (SUTURE) ×4 IMPLANT
SUT VIC AB 3-0 SH 8-18 (SUTURE) ×4 IMPLANT
SYR 20CC LL (SYRINGE) ×2 IMPLANT
SYR CONTROL 10ML LL (SYRINGE) ×1 IMPLANT
TOWEL OR 17X24 6PK STRL BLUE (TOWEL DISPOSABLE) ×2 IMPLANT
TOWEL OR 17X26 10 PK STRL BLUE (TOWEL DISPOSABLE) ×2 IMPLANT
TRAP SPECIMEN MUCOUS 40CC (MISCELLANEOUS) ×2 IMPLANT
TRAY FOLEY CATH 14FRSI W/METER (CATHETERS) ×2 IMPLANT
WATER STERILE IRR 1000ML POUR (IV SOLUTION) ×2 IMPLANT

## 2012-07-22 NOTE — Interval H&P Note (Signed)
History and Physical Interval Note:  07/22/2012 7:36 AM  Ronnie Robinson  has presented today for surgery, with the diagnosis of Lumbar hnp with myelopathy, Lumbar radiculopathy, Lumbar stenosis, Lumbar spondylosis with myelopathy  The various methods of treatment have been discussed with the patient and family. After consideration of risks, benefits and other options for treatment, the patient has consented to  Procedure(s) with comments: POSTERIOR LUMBAR FUSION 2 LEVEL (N/A) - L3-L5 Decompressive Laminectomy/posterior lumbar interbody fusion/Sabre cages/Expedium screws/Cellsaver as a surgical intervention .  The patient's history has been reviewed, patient examined, no change in status, stable for surgery.  I have reviewed the patient's chart and labs.  Questions were answered to the patient's satisfaction.     Abhinav Mayorquin R

## 2012-07-22 NOTE — Anesthesia Preprocedure Evaluation (Addendum)
Anesthesia Evaluation  Patient identified by MRN, date of birth, ID band Patient awake    Reviewed: Allergy & Precautions, H&P , NPO status , Patient's Chart, lab work & pertinent test results  Airway Mallampati: II TM Distance: >3 FB Neck ROM: full    Dental  (+) Teeth Intact and Dental Advisory Given   Pulmonary former smoker,          Cardiovascular hypertension,     Neuro/Psych    GI/Hepatic   Endo/Other    Renal/GU      Musculoskeletal  (+) Arthritis -,   Abdominal   Peds  Hematology   Anesthesia Other Findings   Reproductive/Obstetrics                          Anesthesia Physical Anesthesia Plan  ASA: II  Anesthesia Plan: General   Post-op Pain Management:    Induction: Intravenous  Airway Management Planned: Oral ETT  Additional Equipment:   Intra-op Plan:   Post-operative Plan: Extubation in OR  Informed Consent: I have reviewed the patients History and Physical, chart, labs and discussed the procedure including the risks, benefits and alternatives for the proposed anesthesia with the patient or authorized representative who has indicated his/her understanding and acceptance.     Plan Discussed with: CRNA and Surgeon  Anesthesia Plan Comments:         Anesthesia Quick Evaluation

## 2012-07-22 NOTE — Op Note (Signed)
07/22/2012  11:49 AM  PATIENT:  Ronnie Robinson  79 y.o. 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  PROCEDURE:  Procedure(s): Decompressive laminectomy decompressing L3, L4, L5 nerve roots (3 levels) , PLIF L3-4, L4-5 , interbody cages L3-4, L4-5 ,   Segmented expedium pedicle screw fixation L3-5 , posteriorlateral fusion  L3-5,  Autograft, allograft, bonemarrow aspirate  SURGEON:  Surgeon(s): Clydene Fake, MD Temple Pacini, MD-assist   ANESTHESIA:   general  EBL:  Total I/O In: 3200 [I.V.:2700; IV Piggyback:500] Out: 575 [Urine:75; Blood:500]  BLOOD ADMINISTERED:none  DRAINS: none   SPECIMEN:  No Specimen  DICTATION: Patient having back left leg pain numbness bilateral feet numbness and trouble walking. Workup was lumbar spine include x-rays and MRI showing severe stenosis at C3-4 with a left-sided disc herniation extending caudally and severe stenosis L4-5 with a anterior spondylolisthesis of 4 and 5. After discussion options and let to bring patient for decompressive lamina fusion at these levels.  Patient brought into operating room general anesthesia induced patient placed in prone position Wilson frame all pressure points padded. Patient prepped draped sterile fashion segments inject with 20 cc 1% lidocaine with epinephrine. Needle was placed interspace x-rays attention needle was putting at the four-part space incision was then made centered with needle was then taken cephalad incision taken the fascia hemostasis obtained cauterization fascia incised with a Bovie and subperiosteal dissection done over the spinous process lamina to the facets and exposing  the transverse processes of L3-5 bilaterally. Roger placed at the pedicle at point for 3.5 lateral x-rays obtained and this confirmed or positioning. Decompressive laminectomy started with Leksell rongeurs and completed with Kerrison punches a high-speed drill. Range of motion spinous process and lamina of L3 all of L4 and top of L5 decompress the central canal and dissection at to laterally and medial facetectomies to decompress the lateral recess stenosis and decompressed the L3-L4 and L5 nerve roots bilaterally. We explored the space and 3 for the left side and very large disc herniation was seen extending from the disc space going caudally up under the nerve root these fragments of disc were removed. Incised the space and three fourths bilaterally did discectomy and prepare the interbody space for interbody fusion by distracted up to 11 mm using curettes broaches and pituitary rongeurs cleaning out to the disc space and prepare the interspace for interbody fusion. All bone was removed during the laminectomy was cleaned from soft tissue chart the small pieces. We packed 2 lamina and cages with the autograft bone and packed bone in the interbody space and tapped the cages into position on each side. Attention was and the L4-5 level of the disc space and we can see that the spinal thesis that causing foraminal narrowing and lumbar time decompressing the foramen up to laterally into the forward we will decompressed at. We incised the displacement discectomy bilaterally distracted up to 11 mm to prepare the interbody space for interbody fusion to just like the above level, packed  two 12 mm cages, packed the interspace with autograft bone and then tapped the cages into place at the 45 level. Fluoroscopy was used to showed good position of the cages and on at which point. We decorticated lateral facets transverse process with high-speed drill and decorticated to the pedicle  entry points using fluoroscopy and landmarks  and then placed a pedicle probe down the pedicle checked with a small ball probe and sure he did bony circumference aspirated bone marrow aspirate to put in the allograft sponge and placed Expedium pedicle screw is. 2 screws placed at L3 to L4-L5. Final AP and lateral for imaging showing good position of the screws. Roger then placed the screw heads and tightened with some distraction across the disc spaces as we tightened the nuts and rods bilaterally. We then packed with the autograft bone with some and DBX putty and the allograft sponge mixed with bone marrow aspirate and the posterior lateral gutters for posterolateral fusion bilaterally L3-5. We then explored the epidural space again the central canal and nerve roots we did decompression central canal and good decompression of nerve roots L3-L4 and L5 bilaterally. It hemostasis we irrigated with antibiotic solution placed in [place] over the lateral gutters over the dorsum of bone graft impingement nerve roots retractors removed we did hemostasis fascia closed with 0 Vicryl interrupted sutures subcutaneous incision closed with 020 3-0 Vicryl interrupted sutures skin closed benzoin Steri-Strips dressing was placed patient placed back in supine position woken from anesthesia and transferred to recovery room  PLAN OF CARE: Admit to inpatient   PATIENT DISPOSITION:  PACU - hemodynamically stable.

## 2012-07-22 NOTE — Anesthesia Postprocedure Evaluation (Signed)
  Anesthesia Post-op Note  Patient: Ronnie Robinson  Procedure(s) Performed: Procedure(s) with comments: POSTERIOR LUMBAR FUSION 2 LEVEL (N/A) - Lumbar three-four, lumbarfour-five Decompressive Laminectomy/posterior lumbar interbody fusion/Sabre cages/Expedium screws/Cellsaver  Patient Location: PACU  Anesthesia Type:General  Level of Consciousness: awake  Airway and Oxygen Therapy: Patient Spontanous Breathing  Post-op Pain: mild  Post-op Assessment: Post-op Vital signs reviewed  Post-op Vital Signs: Reviewed  Complications: No apparent anesthesia complications

## 2012-07-22 NOTE — Transfer of Care (Signed)
Immediate Anesthesia Transfer of Care Note  Patient: Ronnie Robinson  Procedure(s) Performed: Procedure(s) with comments: POSTERIOR LUMBAR FUSION 2 LEVEL (N/A) - Lumbar three-four, lumbarfour-five Decompressive Laminectomy/posterior lumbar interbody fusion/Sabre cages/Expedium screws/Cellsaver  Patient Location: PACU  Anesthesia Type:General  Level of Consciousness: awake and sedated  Airway & Oxygen Therapy: Patient Spontanous Breathing and Patient connected to face mask oxygen  Post-op Assessment: Report given to PACU RN, Post -op Vital signs reviewed and stable and Patient moving all extremities  Post vital signs: Reviewed and stable  Complications: No apparent anesthesia complications

## 2012-07-22 NOTE — H&P (Signed)
See H& P.

## 2012-07-22 NOTE — Preoperative (Signed)
Beta Blockers   Reason not to administer Beta Blockers:Not Applicable 

## 2012-07-23 MED ORDER — TRAMADOL HCL 50 MG PO TABS
50.0000 mg | ORAL_TABLET | Freq: Four times a day (QID) | ORAL | Status: DC | PRN
  Administered 2012-07-24: 50 mg via ORAL
  Filled 2012-07-23: qty 1

## 2012-07-23 MED ORDER — MORPHINE SULFATE 2 MG/ML IJ SOLN: 2.0000 mg | INTRAMUSCULAR | Status: DC | PRN

## 2012-07-23 MED ORDER — OXYCODONE-ACETAMINOPHEN 5-325 MG PO TABS
1.0000 | ORAL_TABLET | ORAL | Status: DC | PRN
  Administered 2012-07-24 – 2012-07-25 (×4): 2 via ORAL
  Filled 2012-07-23 (×4): qty 2

## 2012-07-23 MED ORDER — HYDROCODONE-ACETAMINOPHEN 10-325 MG PO TABS: 1.0000 | ORAL_TABLET | ORAL | Status: DC | PRN

## 2012-07-23 NOTE — Progress Notes (Signed)
Patient sit on the side of the bed to walk. Pt dressing soaked with blood and bed pad has blood mark. Pt dressing reinforced. Pt denies any dizziness. Pt able to walk in the hallway about 70 ft with assistance, brace and walker. Will continue to monitor patient.

## 2012-07-23 NOTE — Evaluation (Signed)
Physical Therapy Evaluation Patient Details Name: Ronnie Robinson MRN: [id number] DOB: 12/12/1931 Today's Date: 07/23/2012 Time: 4098-1191 PT Time Calculation (min): 25 min  PT Assessment / Plan / Recommendation Clinical Impression  Pt 79 yo male s/p PLF x 2 level mobilizing well. Anticipate pt to be safe to d/c home with assist of spouse, RW, and 3-n-1 commode. Acute PT to follow pt to maximize fucntional mobiltity while adhereing to back precautions to decrease burden of care on spouse upon d/c home.    PT Assessment  Patient needs continued PT services    Follow Up Recommendations  Supervision/Assistance - 24 hour;No PT follow up    Does the patient have the potential to tolerate intense rehabilitation      Barriers to Discharge None      Equipment Recommendations  Rolling walker with 5" wheels (3-n-1 commode)    Recommendations for Other Services     Frequency Min 5X/week    Precautions / Restrictions Precautions Precautions: Back Precaution Booklet Issued: Yes (comment) Precaution Comments: pt able to recall all back precautions Required Braces or Orthoses: Spinal Brace Spinal Brace: Thoracolumbosacral orthotic (was already on pt) Restrictions Weight Bearing Restrictions: No   Pertinent Vitals/Pain 4/10 surgical back pain        Mobility  Bed Mobility Bed Mobility: Not assessed (pt received sitting up in chair) Rolling Left: 4: Min assist;With rail Left Sidelying to Sit: 4: Min guard;With rails;HOB flat Transfers Transfers: Sit to Stand;Stand to Sit Sit to Stand: 4: Min assist;With upper extremity assist;From chair/3-in-1 Stand to Sit: 4: Min assist;To chair/3-in-1;With upper extremity assist Details for Transfer Assistance: v/c's for hand placement Ambulation/Gait Ambulation/Gait Assistance: 4: Min assist Ambulation Distance (Feet): 150 Feet Assistive device: Rolling walker Ambulation/Gait Assistance Details: v/c's for walker management to stay in walker  and not push to far in front of pt Gait Pattern: Step-through pattern;Decreased stride length Gait velocity: wfl for surgery Stairs: No    Exercises     PT Diagnosis: Difficulty walking;Acute pain  PT Problem List: Decreased activity tolerance;Decreased mobility PT Treatment Interventions: Gait training;Stair training;Functional mobility training;Therapeutic activities;Therapeutic exercise   PT Goals Acute Rehab PT Goals PT Goal Formulation: With patient Time For Goal Achievement: 07/30/12 Potential to Achieve Goals: Good Pt will Roll Supine to Left Side: with modified independence PT Goal: Rolling Supine to Left Side - Progress: Goal set today Pt will go Supine/Side to Sit: with modified independence;with HOB 0 degrees PT Goal: Supine/Side to Sit - Progress: Goal set today Pt will go Sit to Supine/Side: with modified independence;with HOB 0 degrees PT Goal: Sit to Supine/Side - Progress: Goal set today Pt will go Sit to Stand: with modified independence;with upper extremity assist (up to RW) PT Goal: Sit to Stand - Progress: Goal set today Pt will Ambulate: >150 feet;with modified independence;with rolling walker PT Goal: Ambulate - Progress: Goal set today Pt will Go Up / Down Stairs: Flight;with supervision;with rail(s) PT Goal: Up/Down Stairs - Progress: Goal set today Additional Goals Additional Goal #1: Pt able to recall 3/3 precautions independently and be 100% compliant with them PT Goal: Additional Goal #1 - Progress: Goal set today  Visit Information  Last PT Received On: 07/23/12 Assistance Needed: +1    Subjective Data  Subjective: Pt received sitting up in chair with TLSO on.   Prior Functioning  Home Living Lives With: Spouse Available Help at Discharge: Family;Available 24 hours/day Type of Home:  (town home) Home Access: Stairs to enter [employer] of Steps:  2 STE w/ rails Entrance Stairs-Rails: Can reach both Home Layout: Two level;Full bath  on main level Alternate Level Stairs-Number of Steps: Full flight of stairs to bedroom 10-12 steps Alternate Level Stairs-Rails: Can reach both Bathroom Shower/Tub: Walk-in shower;Door [employer] Toilet: Standard Bathroom Accessibility: Yes How Accessible: Accessible via walker Home Adaptive Equipment: Grab bars in shower;Built-in shower seat Additional Comments: Pt is unsure if he mat have access to a walker Prior Function Level of Independence: Independent Able to Take Stairs?: Yes Driving: Yes Vocation: Retired Musician: No difficulties Dominant Hand: Right    Cognition  Cognition Overall Cognitive Status: Appears within functional limits for tasks assessed/performed Arousal/Alertness: Awake/alert Orientation Level: Appears intact for tasks assessed Behavior During Session: [facility] for tasks performed    Extremity/Trunk Assessment Right Upper Extremity Assessment RUE ROM/Strength/Tone: Within functional levels RUE Coordination: WFL - gross/fine motor Left Upper Extremity Assessment LUE ROM/Strength/Tone: Within functional levels LUE Coordination: WFL - gross/fine motor Right Lower Extremity Assessment RLE ROM/Strength/Tone: Within functional levels Left Lower Extremity Assessment LLE ROM/Strength/Tone: Within functional levels Trunk Assessment Trunk Assessment: Normal   Balance Balance Balance Assessed: Yes Static Sitting Balance Static Sitting - Balance Support: No upper extremity supported;Feet supported Static Sitting - Level of Assistance: 6: Modified independent (Device/Increase time)  End of Session PT - End of Session Equipment Utilized During Treatment: Gait belt;Back brace Activity Tolerance: Patient tolerated treatment well Patient left: in chair;with call bell/phone within reach;with nursing in room Nurse Communication: Mobility status  GP     Marcene Brawn 07/23/2012, 9:46 AM

## 2012-07-23 NOTE — Progress Notes (Signed)
Assisted patient from chair to bed. Noticed dressing soaked through the ABD. Replaced with new ABD and reinforced dressing. Pt denies pain at incision site or dizziness and has no fever. Will notify Dr. Phoebe Perch when he rounds and will continue to monitor.

## 2012-07-23 NOTE — Evaluation (Signed)
Occupational Therapy Evaluation Patient Details Name: Ronnie Robinson MRN: [id number] DOB: February 23, 1932 Today's Date: 07/23/2012 Time: 4098-1191 OT Time Calculation (min): 35 min  OT Assessment / Plan / Recommendation Clinical Impression  Pt is a 79 y/o male s/p laminectomy w/ decompression L3, L4, L5. He currently requires Mod A don back brace in sitting and Min A UB ADL's and Max A LB ADL's. Pt reports that his wife will be able to assist 24hr/PRN at home. He will need a 3:1 for home use and HHOT recommended.     OT Assessment  Patient needs continued OT Services    Follow Up Recommendations  Home health OT    Barriers to Discharge      Equipment Recommendations  3 in 1 bedside comode    Recommendations for Other Services    Frequency  Min 2X/week    Precautions / Restrictions Precautions Precautions: Back Required Braces or Orthoses: Spinal Brace Spinal Brace: Applied in sitting position Restrictions Weight Bearing Restrictions: No   Pertinent Vitals/Pain Pt reports 2/10 low back pain.    ADL  Eating/Feeding: Performed;Independent Where Assessed - Eating/Feeding: Bed level Grooming: Performed;Wash/dry hands;Min guard Where Assessed - Grooming: Unsupported standing Upper Body Bathing: Simulated;Minimal assistance Where Assessed - Upper Body Bathing: Unsupported sitting Lower Body Bathing: Simulated;Minimal assistance Where Assessed - Lower Body Bathing: Unsupported sit to stand Upper Body Dressing: Simulated;Minimal assistance Where Assessed - Upper Body Dressing: Unsupported sitting Lower Body Dressing: Performed;Maximal assistance Where Assessed - Lower Body Dressing: Unsupported sit to stand;Unsupported sitting Toilet Transfer: Performed;Minimal assistance Toilet Transfer Method: Sit to stand Toilet Transfer Equipment: Comfort height toilet;Grab bars Toileting - Clothing Manipulation and Hygiene: Performed;Minimal assistance Where Assessed - Glass blower/designer  Manipulation and Hygiene: Sit on 3-in-1 or toilet Equipment Used: Back brace;Rolling walker Transfers/Ambulation Related to ADLs: Pt moves slowly due to IV/lines, benefits from VC's for safety and sequencing w/ RW as well as adherence to back precautions ADL Comments: Pt will benefit from acute OT to address limitations in ADL's and functional mobility. He requires Mod A to don back brace in sitting and will need 3:1 for home use at discharge. Pt reports that his wife can assist PRN at home. Note, pt's bedroom & walk in shower is on second floor of townhouse, but he does have a full bath (tub/shower) on first floor.    OT Diagnosis: Generalized weakness;Acute pain  OT Problem List: Decreased activity tolerance;Decreased knowledge of use of DME or AE;Decreased knowledge of precautions;Pain OT Treatment Interventions: Self-care/ADL training;DME and/or AE instruction;Therapeutic activities;Patient/family education   OT Goals Acute Rehab OT Goals OT Goal Formulation: With patient Time For Goal Achievement: 08/06/12 Potential to Achieve Goals: Good ADL Goals Pt Will Perform Grooming: with supervision;Standing at sink ADL Goal: Grooming - Progress: Goal set today Pt Will Perform Upper Body Dressing: with supervision;with min assist;Sitting, bed;Sitting, chair ADL Goal: Upper Body Dressing - Progress: Goal set today Pt Will Perform Lower Body Dressing: with min assist;Sit to stand from bed;Sit to stand from chair;with adaptive equipment ADL Goal: Lower Body Dressing - Progress: Goal set today Pt Will Transfer to Toilet: with supervision ADL Goal: Toilet Transfer - Progress: Goal set today Pt Will Perform Toileting - Clothing Manipulation: with supervision ADL Goal: Toileting - Clothing Manipulation - Progress: Goal set today Pt Will Perform Toileting - Hygiene: with modified independence;Sitting on 3-in-1 or toilet ADL Goal: Toileting - Hygiene - Progress: Goal set today Pt Will Perform  Tub/Shower Transfer: Shower transfer;with supervision;Shower seat with back;Grab bars;Maintaining  back safety precautions ADL Goal: Tub/Shower Transfer - Progress: Goal set today Additional ADL Goal #1: Pt will be Min assist don back brace in sitting in prep for ADL's & I state 3/3 back precautions and implement during ADL tasks ADL Goal: Additional Goal #1 - Progress: Goal set today  Visit Information  Last OT Received On: 07/23/12 Assistance Needed: +1    Subjective Data  Subjective: I live with my wife in a town house Patient Stated Goal: Return home as able w/ assist from wife   Prior Functioning     Home Living Lives With: Spouse Available Help at Discharge: Available 24 hours/day Type of Home: Apartment Home Access: Stairs to enter [employer] of Steps: 2 STE w/ rails Entrance Stairs-Rails: Can reach both Home Layout: Two level;Full bath on main level Alternate Level Stairs-Number of Steps: Full flight of stairs to bedroom 10-12 steps Alternate Level Stairs-Rails: Can reach both Bathroom Shower/Tub: Walk-in shower;Door [employer] Toilet: Standard Bathroom Accessibility: Yes How Accessible: Accessible via walker Home Adaptive Equipment: Grab bars in shower;Built-in shower seat Additional Comments: Pt is unsure if he mat have access to a walker Prior Function Level of Independence: Independent Able to Take Stairs?: Yes Driving: Yes Vocation: Retired Musician: No difficulties Dominant Hand: Right         Vision/Perception Vision - History Baseline Vision: Wears glasses all the time Visual History: Cataracts Vision - Assessment Eye Alignment: Within Functional Limits   Cognition  Cognition Overall Cognitive Status: Appears within functional limits for tasks assessed/performed Arousal/Alertness: Awake/alert Orientation Level: Appears intact for tasks assessed Behavior During Session: [facility] for tasks performed    Extremity/Trunk  Assessment Right Upper Extremity Assessment RUE ROM/Strength/Tone: Within functional levels RUE Coordination: WFL - gross/fine motor Left Upper Extremity Assessment LUE ROM/Strength/Tone: Within functional levels LUE Coordination: WFL - gross/fine motor     Mobility Bed Mobility Bed Mobility: Rolling Left;Left Sidelying to Sit Rolling Left: 4: Min assist;With rail Left Sidelying to Sit: 4: Min guard;With rails;HOB flat Transfers Transfers: Sit to Stand;Stand to Sit Sit to Stand: 4: Min assist;From bed;From chair/3-in-1;With upper extremity assist Stand to Sit: 4: Min assist;To chair/3-in-1;With upper extremity assist Details for Transfer Assistance: VC's safety and sequencing w/ RW as well as for hand positioning and back precautions.        Balance Balance Balance Assessed: Yes Static Sitting Balance Static Sitting - Balance Support: No upper extremity supported;Feet supported Static Sitting - Level of Assistance: 6: Modified independent (Device/Increase time)   End of Session OT - End of Session Equipment Utilized During Treatment: Back brace Activity Tolerance: Patient tolerated treatment well Patient left: in chair;with call bell/phone within reach Nurse Communication: Mobility status  GO     Alm Bustard 07/23/2012, 8:58 AM

## 2012-07-23 NOTE — Progress Notes (Signed)
Doing well. C/o appropriate incisional soreness. No leg pain No Numbness, tingling, weakness No Nausea /vomiting Amb well   Temp:  [97.6 F (36.4 C)-100.9 F (38.3 C)] 98.3 F (36.8 C) (02/11 1135) Pulse Rate:  [57-81] 66 (02/11 1135) Resp:  [11-20] 16 (02/11 1200) BP: (115-150)/(48-70) 126/48 mmHg (02/11 1135) SpO2:  [96 %-100 %] 96 % (02/11 1200) Weight:  [90.13 kg (198 lb 11.2 oz)] 90.13 kg (198 lb 11.2 oz) (02/10 1736) Good strength and sensation Incision some drainage  Plan: Increase activity

## 2012-07-24 ENCOUNTER — Ambulatory Visit: Payer: Medicare Other | Admitting: Oncology

## 2012-07-24 NOTE — Progress Notes (Signed)
Physical Therapy Treatment Patient Details Name: Ronnie Robinson MRN: [id number] DOB: 02/13/1932 Today's Date: 07/24/2012 Time: 5284-1324 PT Time Calculation (min): 23 min  PT Assessment / Plan / Recommendation Comments on Treatment Session  Pt with mild regression in mobility however mobilizing well in general. Pt con't to require v/c's for back preacuations and assist for donning brace. Anticipate pt to be safe to d/c home with spouse, RW, and 3n1 commode. Will attempt to see patient when wife present tomorrow to educate wife on how to assist pt on donning of brace.    Follow Up Recommendations  Home health PT;Supervision/Assistance - 24 hour     Does the patient have the potential to tolerate intense rehabilitation     Barriers to Discharge        Equipment Recommendations  Rolling walker with 5" wheels (3n1 commode)    Recommendations for Other Services    Frequency Min 5X/week   Plan Discharge plan remains appropriate;Frequency remains appropriate    Precautions / Restrictions Precautions Precautions: Back Precaution Booklet Issued: Yes (comment) Precaution Comments: pt able to recall 3/3 back precautions but unable to be comliant Required Braces or Orthoses: Spinal Brace Spinal Brace: Thoracolumbosacral orthotic;Applied in sitting position (maxA to don brace) Restrictions Weight Bearing Restrictions: No   Pertinent Vitals/Pain 7/10 surgical back pain    Mobility  Bed Mobility Bed Mobility: Rolling Left;Left Sidelying to Sit Rolling Left: 4: Min assist Left Sidelying to Sit: 4: Min assist;HOB flat Details for Bed Mobility Assistance: v/c's to log roll to eliminate twisting, assist for trunk elevation to maintain back prec Transfers Transfers: Sit to Stand;Stand to Sit Sit to Stand: 4: Min assist;With upper extremity assist;From bed Stand to Sit: 4: Min assist;With upper extremity assist;To chair/3-in-1 Details for Transfer Assistance: v/c's for hand placement,  increased time, v/c's to limit bending Ambulation/Gait Ambulation/Gait Assistance: 4: Min assist Ambulation Distance (Feet): 150 Feet Assistive device: Rolling walker Ambulation/Gait Assistance Details: pt with increased bilat knee flexion and trunk flexion today. pt reports "I have more pain today." "I don't think I"m walking as good." Gait Pattern: Step-through pattern;Decreased stride length (decreased step height) Gait velocity: decr Stairs: Yes Stairs Assistance: 3: Mod assist Stairs Assistance Details (indicate cue type and reason): R hand rail, L HHA to mimic bilat handrail at home, increased time Stair Management Technique: One rail Right;Step to pattern Number of Stairs: 5    Exercises     PT Diagnosis:    PT Problem List:   PT Treatment Interventions:     PT Goals Acute Rehab PT Goals PT Goal: Rolling Supine to Left Side - Progress: Progressing toward goal PT Goal: Supine/Side to Sit - Progress: Progressing toward goal PT Goal: Sit to Supine/Side - Progress: Progressing toward goal PT Goal: Sit to Stand - Progress: Progressing toward goal PT Goal: Ambulate - Progress: Progressing toward goal PT Goal: Up/Down Stairs - Progress: Progressing toward goal Additional Goals PT Goal: Additional Goal #1 - Progress: Progressing toward goal  Visit Information  Last PT Received On: 07/24/12 Assistance Needed: +1    Subjective Data  Subjective: Pt received sleeping supine in bed, pt easily awakend and agreeable to PT.   Cognition  Cognition Overall Cognitive Status: Appears within functional limits for tasks assessed/performed Arousal/Alertness: Awake/alert Orientation Level: Appears intact for tasks assessed Behavior During Session: [facility] for tasks performed    Balance     End of Session PT - End of Session Equipment Utilized During Treatment: Gait belt Activity Tolerance: Patient tolerated  treatment well Patient left: in chair;with call bell/phone within reach;with  nursing in room Nurse Communication: Mobility status   GP     Marcene Brawn 07/24/2012, 3:39 PM   Lewis Shock, PT, DPT Pager #: [phone] Office #: [phone]

## 2012-07-24 NOTE — Progress Notes (Signed)
Doing well. C/o appropriate incisional soreness.  No Numbness, tingling, weakness No Nausea /vomiting Amb/ voiding well  Temp:  [98.3 F (36.8 C)-101.1 F (38.4 C)] 98.7 F (37.1 C) (02/12 0628) Pulse Rate:  [65-107] 65 (02/12 0628) Resp:  [16-20] 20 (02/12 0628) BP: (112-146)/(46-58) 119/51 mmHg (02/12 0628) SpO2:  [92 %-100 %] 96 % (02/12 0628) Good strength and sensation Incision  Some drainage  Plan: Increase activity - ? D/C in am

## 2012-07-24 NOTE — Progress Notes (Signed)
Occupational Therapy Treatment Patient Details Name: Ronnie Robinson MRN: [id number] DOB: 12/11/1931 Today's Date: 07/24/2012 Time: 2952-8413 OT Time Calculation (min): 43 min  OT Assessment / Plan / Recommendation Comments on Treatment Session Pt progressing with ADLs.  Needs to continue to practice with AE, and needs reinforcement with bed mobility, walker safety     Follow Up Recommendations  Home health OT    Barriers to Discharge       Equipment Recommendations  3 in 1 bedside comode    Recommendations for Other Services    Frequency Min 2X/week   Plan Discharge plan remains appropriate    Precautions / Restrictions Precautions Precautions: Back Precaution Booklet Issued: Yes (comment) Precaution Comments: pt able to recall 3/3 back precautions but unable to be comliant Required Braces or Orthoses: Spinal Brace Spinal Brace: Thoracolumbosacral orthotic;Applied in sitting position Restrictions Weight Bearing Restrictions: No   Pertinent Vitals/Pain     ADL  Grooming: Performed;Wash/dry hands;Min guard Where Assessed - Grooming: Supported standing Lower Body Bathing: Simulated;Min guard Where Assessed - Lower Body Bathing: Supported sit to stand Lower Body Dressing: Performed;Minimal assistance Where Assessed - Lower Body Dressing: Supported sit to stand (with AE) Toilet Transfer: Performed;Minimal Dentist Method: Sit to Barista: Comfort height toilet;Grab bars Toileting - Clothing Manipulation and Hygiene: Performed;Min guard Where Assessed - Engineer, mining and Hygiene: Standing Equipment Used: Back brace;Rolling walker;Reacher;Long-handled sponge;Long-handled shoe horn;Sock aid Transfers/Ambulation Related to ADLs: Pt ambulates with min guard assist and mod cues for walker safety ADL Comments: Reinforced back precatuions.  Pt. instructed in use of AE for LB ADLs.  Pt. was able to demonstrate use of AE with  min A.  Discussed use of 3-in-1 at home    OT Diagnosis:    OT Problem List:   OT Treatment Interventions:     OT Goals Acute Rehab OT Goals OT Goal Formulation: With patient Time For Goal Achievement: 08/06/12 Potential to Achieve Goals: Good ADL Goals ADL Goal: Grooming - Progress: Progressing toward goals Pt Will Perform Lower Body Dressing: with supervision ADL Goal: Lower Body Dressing - Progress: Goal set today ADL Goal: Toilet Transfer - Progress: Progressing toward goals ADL Goal: Toileting - Clothing Manipulation - Progress: Progressing toward goals ADL Goal: Toileting - Hygiene - Progress: Progressing toward goals ADL Goal: Additional Goal #1 - Progress: Progressing toward goals  Visit Information  Last OT Received On: 07/24/12 Assistance Needed: +1    Subjective Data      Prior Functioning       Cognition  Cognition Overall Cognitive Status: Appears within functional limits for tasks assessed/performed Arousal/Alertness: Lethargic Orientation Level: Appears intact for tasks assessed Behavior During Session: [facility] for tasks performed    Mobility  Bed Mobility Bed Mobility: Sit to Sidelying Left Rolling Left: 4: Min assist Left Sidelying to Sit: 4: Min assist;HOB flat Sit to Sidelying Left: 4: Min assist;HOB flat Details for Bed Mobility Assistance: Required min A and mod verbal cues for technique Transfers Transfers: Sit to Stand;Stand to Sit Sit to Stand: 4: Min guard;From chair/3-in-1;From toilet;With upper extremity assist Stand to Sit: 4: Min guard;4: Min assist;With upper extremity assist;To bed;To chair/3-in-1;To toilet Details for Transfer Assistance: Verbal cues for hand placeement and to avoid bending    Exercises      Balance     End of Session OT - End of Session Equipment Utilized During Treatment: Back brace Activity Tolerance: Patient tolerated treatment well Patient left: in bed;with call bell/phone within  reach  GO     Keshona Kartes,  Ursula Alert M 07/24/2012, 4:25 PM

## 2012-07-25 MED ORDER — HYDROCODONE-ACETAMINOPHEN 5-325 MG PO TABS: 1.0000 | ORAL_TABLET | ORAL | Status: DC | PRN

## 2012-07-25 MED ORDER — WHITE PETROLATUM GEL
Status: AC
  Filled 2012-07-25: qty 5

## 2012-07-25 NOTE — Progress Notes (Signed)
Doing well. C/o appropriate incisional soreness. No Numbness, tingling, weakness No Nausea /vomiting Amb/ voiding well  Temp:  [98.1 F (36.7 C)-99.9 F (37.7 C)] 99.9 F (37.7 C) (02/13 0516) Pulse Rate:  [66-84] 69 (02/13 0516) Resp:  [17-20] 17 (02/13 0516) BP: (105-139)/(45-74) 126/52 mmHg (02/13 0516) SpO2:  [94 %-99 %] 94 % (02/13 0516) Good strength and sensation Incision CDI  Plan: Increase activity - home next 1-2 days

## 2012-07-25 NOTE — Progress Notes (Signed)
Physical Therapy Treatment Patient Details Name: DENYM RAHIMI MRN: [id number] DOB: 04-08-1932 Today's Date: 07/25/2012 Time: 4098-1191 PT Time Calculation (min): 24 min  PT Assessment / Plan / Recommendation Comments on Treatment Session  Pt making steady progress with mobility.  Requires cues to reinforce back precautions with functional mobility.      Follow Up Recommendations  Home health PT;Supervision/Assistance - 24 hour     Does the patient have the potential to tolerate intense rehabilitation     Barriers to Discharge        Equipment Recommendations  Rolling walker with 5" wheels    Recommendations for Other Services    Frequency Min 5X/week   Plan Discharge plan remains appropriate;Frequency remains appropriate    Precautions / Restrictions Precautions Precautions: Back Precaution Booklet Issued: Yes (comment) Precaution Comments: Pt able to recall 3/3 back precautions but requires cueing to reinforce with functional mobility Required Braces or Orthoses: Spinal Brace Spinal Brace: Thoracolumbosacral orthotic Restrictions Weight Bearing Restrictions: No       Mobility  Bed Mobility Bed Mobility: Rolling Right;Right Sidelying to Sit;Sitting - Scoot to [place] of Bed Rolling Right: 4: Min guard Right Sidelying to Sit: 4: Min guard;HOB flat Sitting - Scoot to Edge of Bed: 5: Supervision Sit to Supine: 4: Min assist;HOB flat Scooting to HOB: 4: Min assist Details for Bed Mobility Assistance: Cues to reinforce sequencing, technique, & back precautions.  Increased time.   Transfers Transfers: Sit to Stand;Stand to Sit Sit to Stand: 4: Min guard;With upper extremity assist;With armrests;From chair/3-in-1;From bed Stand to Sit: 4: Min guard;With upper extremity assist;With armrests;To chair/3-in-1 Details for Transfer Assistance: Cues for safest hand placement Ambulation/Gait Ambulation/Gait Assistance: 4: Min guard Ambulation Distance (Feet): 200 Feet Assistive  device: Rolling walker Ambulation/Gait Assistance Details: Cues for tall posture-- pt with shoulders rounded forwards & bil knee flexion during stance phase.   Gait Pattern: Step-through pattern;Decreased stride length (decreased step height) Stairs: Yes Stairs Assistance: 3: Mod assist Stairs Assistance Details (indicate cue type and reason): Rt hand rail, Lt HHA to mimic home setup.   Stair Management Technique: One rail Right;Step to pattern;Forwards;Other (comment) (Lt HHA ) Number of Stairs: 10 Wheelchair Mobility Wheelchair Mobility: No      PT Goals Acute Rehab PT Goals Time For Goal Achievement: 07/30/12 Potential to Achieve Goals: Good Pt will Roll Supine to Left Side: with modified independence Pt will go Supine/Side to Sit: with modified independence;with HOB 0 degrees PT Goal: Supine/Side to Sit - Progress: Progressing toward goal Pt will go Sit to Supine/Side: with modified independence;with HOB 0 degrees Pt will go Sit to Stand: with modified independence;with upper extremity assist PT Goal: Sit to Stand - Progress: Progressing toward goal Pt will Ambulate: >150 feet;with modified independence;with rolling walker PT Goal: Ambulate - Progress: Progressing toward goal Pt will Go Up / Down Stairs: Flight;with supervision;with rail(s) PT Goal: Up/Down Stairs - Progress: Progressing toward goal Additional Goals Additional Goal #1: Pt able to recall 3/3 precautions independently and be 100% compliant with them PT Goal: Additional Goal #1 - Progress: Not met  Visit Information  Last PT Received On: 07/25/12 Assistance Needed: +1    Subjective Data      Cognition  Cognition Overall Cognitive Status: Appears within functional limits for tasks assessed/performed Arousal/Alertness: Awake/alert Orientation Level: Appears intact for tasks assessed Behavior During Session: [facility] for tasks performed    Balance     End of Session PT - End of Session Equipment Utilized  During  Treatment: Gait belt;Back brace Activity Tolerance: Patient tolerated treatment well Patient left: in chair;with call bell/phone within reach Nurse Communication: Mobility status     Verdell Face, [place] [phone] 07/25/2012

## 2012-07-25 NOTE — Progress Notes (Signed)
Occupational Therapy Treatment Patient Details Name: Ronnie Robinson MRN: [id number] DOB: [birth year] Today's Date: 07/25/2012 Time: 0920-1009 OT Time Calculation (min): 49 min  OT Assessment / Plan / Recommendation    Follow Up Recommendations  Home health OT       Equipment Recommendations  3 in 1 bedside comode       Frequency Min 2X/week   Plan Discharge plan remains appropriate    Precautions / Restrictions Precautions Precautions: Back Precaution Booklet Issued: Yes (comment) Precaution Comments: pt able to recall all back precautions Required Braces or Orthoses: Spinal Brace Spinal Brace: Thoracolumbosacral orthotic (was already on pt) Restrictions Weight Bearing Restrictions: No       ADL  Grooming: Wash/dry hands;Min guard Where Assessed - Grooming: Unsupported standing Lower Body Dressing: Performed;Minimal assistance;Other (comment) (with AE) Where Assessed - Lower Body Dressing: Unsupported sit to stand Toilet Transfer: Performed;Min guard Toilet Transfer Method: Sit to Barista: Comfort height toilet;Grab bars Toileting - Clothing Manipulation and Hygiene: Performed;Minimal assistance Where Assessed - Engineer, mining and Hygiene: Standing      OT Goals ADL Goals Pt Will Perform Lower Body Dressing: Sit to stand from bed ADL Goal: Lower Body Dressing - Progress: Progressing toward goals ADL Goal: Toilet Transfer - Progress: Progressing toward goals ADL Goal: Toileting - Clothing Manipulation - Progress: Progressing toward goals  Visit Information  Last OT Received On: 07/25/12    Subjective Data  Subjective: wife not going to make it here today due to snow         Mobility  Bed Mobility Bed Mobility: Sit to Supine;Scooting to [facility] Rolling Right: 4: Min guard Sit to Supine: 4: Min assist;HOB flat Scooting to HOB: 4: Min assist Details for Bed Mobility Assistance: Pt is concerned regarding bed mobility at home.   OT and pt did discuss a bed rail for home use. OT did instruct where he would obtain Transfers Transfers: Sit to Stand;Stand to Sit Sit to Stand: With upper extremity assist;From chair/3-in-1 Stand to Sit: With upper extremity assist;To chair/3-in-1          End of Session OT - End of Session Equipment Utilized During Treatment: Back brace Activity Tolerance: Patient tolerated treatment well  GO     Chairty Toman, Metro Kung 07/25/2012, 10:30 AM

## 2012-07-26 MED ORDER — HYDROCODONE-ACETAMINOPHEN 5-325 MG PO TABS: 1.0000 | ORAL_TABLET | ORAL | Status: DC | PRN

## 2012-07-26 MED ORDER — CYCLOBENZAPRINE HCL 10 MG PO TABS: 10.0000 mg | ORAL_TABLET | Freq: Three times a day (TID) | ORAL | Status: DC | PRN

## 2012-07-26 NOTE — Discharge Summary (Signed)
Physician Discharge Summary  Patient ID: Ronnie Robinson MRN: [id number] DOB/AGE: Jul 21, 1931 79 y.o.  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 *   Discharged Condition: good  Hospital Course: pt admitted on day of surgery - underwent procedure below - pt with no leg pain  ,l ambulating, voiding well  Consults: None  Significant Diagnostic Studies: none  Treatments: surgery: Decompressive laminectomy decompressing L3, L4, L5 nerve roots (3 levels) , PLIF L3-4, L4-5 , interbody cages L3-4, L4-5 , Segmented expedium pedicle screw fixation L3-5 , posteriorlateral fusion L3-5, Autograft, allograft, bonemarrow aspirate   Discharge Exam: Blood pressure 137/53, pulse 69, temperature 97.9 F (36.6 C), temperature source Oral, resp. rate 18, height 5\' 11"  (1.803 m), weight 90.13 kg (198 lb 11.2 oz), SpO2 100.00%. Wound:c/d/i  Disposition: home  Discharge Orders   Future Appointments Provider Department Dept Phone   10/22/2012 9:00 AM [facility] [phone]   10/22/2012 9:30 AM [facility] [phone]   Patient to arrive 15 minutes prior to appointment time. Patient to pick up oral contrast at least 1 day prior to exam. No solid food 4 hours prior to exam. Liquids and Medicines are okay.   04/29/2013 1:00 PM Dava Najjar [facility] [phone]   04/29/2013 1:30 PM Benjiman Core, MD [place] [facility] [phone]   Future  Orders Complete By Expires     Face-to-face encounter  As directed     Comments:      I Dyna Figuereo R certify that this patient is under my care and that I, or a nurse practitioner or physician's assistant working with me, had a face-to-face encounter that meets the physician face-to-face encounter requirements with this patient on 07/26/2012. The encounter with the patient was in whole, or in part for the following medical condition(s) which is the primary reason for home health care (List medical condition): ....    Questions:      The encounter with the patient was in whole, or in part, for the following medical condition, which is the primary reason for home health care:  ...    I certify that, based on my findings, the following services are medically necessary home health services:  Physical therapy    My clinical findings support the need for the above services:  Unsafe ambulation due to balance issues    Further, I certify that my clinical findings support that this patient is homebound due to:  Unable to leave home safely without assistance    To provide the following care/treatments:  PT    Home Health  As directed     Questions:      To provide the following care/treatments:  PT        Medication List    TAKE these medications       amLODipine 5 MG tablet  Commonly known as:  NORVASC  Take 5 mg by mouth daily.     aspirin 325 MG tablet  Take 325 mg by mouth daily.     atorvastatin 40 MG tablet  Commonly known as:  LIPITOR  Take  40 mg by mouth daily.     cyclobenzaprine 10 MG tablet  Commonly known as:  FLEXERIL  Take 1 tablet (10 mg total) by mouth 3 (three) times daily as needed for muscle spasms.     doxazosin 2 MG tablet  Commonly known as:  CARDURA  Take 2 mg by mouth at bedtime.     fish oil-omega-3 fatty acids 1000 MG capsule  Take 3 g by mouth 2 (two) times daily.     hydrALAZINE 100 MG tablet  Commonly known as:  APRESOLINE  Take 100 mg by mouth 2 (two)  times daily.     hydrochlorothiazide 25 MG tablet  Commonly known as:  HYDRODIURIL  Take 25 mg by mouth daily.     HYDROcodone-acetaminophen 5-325 MG per tablet  Commonly known as:  NORCO/VICODIN  Take 1-2 tablets by mouth every 4 (four) hours as needed.     multivitamin with minerals Tabs  Take 1 tablet by mouth daily.     OCUVITE-LUTEIN PO  Take 1 tablet by mouth daily.     OSTEO BI-FLEX ADV JOINT SHIELD PO  Take 1 tablet by mouth 2 (two) times daily.     pimecrolimus 1 % cream  Commonly known as:  ELIDEL  Apply 1 application topically as needed. For rosacea     psyllium 58.6 % powder  Commonly known as:  METAMUCIL  Take 1 packet by mouth daily.     vitamin B-12 100 MCG tablet  Commonly known as:  CYANOCOBALAMIN  Take 100 mcg by mouth daily.     vitamin C with rose hips 500 MG tablet  Take 500 mg by mouth daily.         Signed: Clydene Fake, MD 07/26/2012, 8:30 AM

## 2012-07-26 NOTE — Care Management Note (Signed)
    Page 1 of 2   07/26/2012     2:58:24 PM   CARE MANAGEMENT NOTE 07/26/2012  Patient:  Ronnie Robinson, Ronnie Robinson   Account Number:  [id number]  Date Initiated:  07/23/2012  Documentation initiated by:  [facility]  Subjective/Objective Assessment:   admitted postop L3-4, L4-5 PLIF     Action/Plan:   PT/OT evals-recommending HHPT, rolling walker and 3N1   Anticipated DC Date:  07/26/2012   Anticipated DC Plan:  HOME W HOME HEALTH SERVICES      DC Planning Services  CM consult      Choice offered to / List presented to:  C-3 Spouse   DME arranged  3-N-1  Levan Hurst      DME agency  [facility]     HH arranged  HH-2 PT      [facility] agency  [facility]   Status of service:  Completed, signed off Medicare Important Message given?   (If response is "NO", the following Medicare IM given date fields will be blank) Date Medicare IM given:   Date Additional Medicare IM given:    Discharge Disposition:  HOME W HOME HEALTH SERVICES  Per UR Regulation:  Reviewed for med. necessity/level of care/duration of stay  If discussed at Long Length of Stay Meetings, dates discussed:    Comments:  07/26/12 Spoke with patient and his wife about HHC for HHPT, they selected [facility] from the [facility] list of [facility] agencies,. Contacted Chelise Hanger at [facility] and set up HHPT. Rolling walker and 3N1 delivered to aptient prior to discharge. Jacquelynn Cree RN, BSN, CCM

## 2012-07-26 NOTE — Progress Notes (Signed)
[facility]  Patient Status: New  [facility] is providing the following services: PT  If patient discharges after hours, please call [phone].   Wynelle Bourgeois 07/26/2012, 2:48 PM

## 2012-07-26 NOTE — Progress Notes (Signed)
Occupational Therapy Treatment Patient Details Name: JIYAAN STEINHAUSER MRN: [id number] DOB: 1931/07/25 Today's Date: 07/26/2012 Time: 4098-1191 OT Time Calculation (min): 18 min  OT Assessment / Plan / Recommendation Comments on Treatment Session Pt progressing with ADL activity but will benefit from skilled OT in home health setting    Follow Up Recommendations  Home health OT             Frequency Min 2X/week   Plan Discharge plan remains appropriate    Precautions / Restrictions Precautions Precautions: Back Precaution Comments: Pt able to recall 3/3 back precautions but requires cueing to reinforce with functional mobility Required Braces or Orthoses: Spinal Brace Spinal Brace: Thoracolumbosacral orthotic Restrictions Weight Bearing Restrictions: No       ADL  Lower Body Dressing: Performed;Min guard;Other (comment) (AE) Toilet Transfer: Performed;Supervision/safety Statistician Method: Sit to Barista: Comfort height toilet;Grab bars Toileting - Architect and Hygiene: Supervision/safety Transfers/Ambulation Related to ADLs: OT session focused on reinterating back precautions with ADL activity, as welll as correct placement of the brace.  Pts wife not present and will be here at some point this day.        OT Goals ADL Goals ADL Goal: Lower Body Dressing - Progress: Progressing toward goals ADL Goal: Toilet Transfer - Progress: Progressing toward goals ADL Goal: Toileting - Clothing Manipulation - Progress: Progressing toward goals ADL Goal: Toileting - Hygiene - Progress: Progressing toward goals ADL Goal: Additional Goal #1 - Progress: Progressing toward goals  Visit Information  Last OT Received On: 07/26/12          Cognition  Cognition Overall Cognitive Status: Appears within functional limits for tasks assessed/performed Arousal/Alertness: Awake/alert Orientation Level: Appears intact for tasks assessed Behavior During  Session: [facility] for tasks performed    Mobility  Transfers Transfers: Sit to Stand;Stand to Sit Sit to Stand: 5: Supervision;From bed;From chair/3-in-1;With upper extremity assist Stand to Sit: 5: Supervision;With upper extremity assist;To chair/3-in-1;With armrests          End of Session OT - End of Session Equipment Utilized During Treatment: Back brace Activity Tolerance: Patient tolerated treatment well Patient left: in chair;with call bell/phone within reach      [facility], [place] Kung 07/26/2012, 9:29 AM

## 2012-10-09 ENCOUNTER — Other Ambulatory Visit: Payer: Self-pay | Admitting: Dermatology

## 2012-10-22 ENCOUNTER — Ambulatory Visit (HOSPITAL_COMMUNITY)
Admission: RE | Admit: 2012-10-22 | Discharge: 2012-10-22 | Disposition: A | Discharge status: Home / Self Care | Payer: Medicare Other | Source: Ambulatory Visit | Attending: Oncology | Admitting: Oncology

## 2012-10-22 ENCOUNTER — Other Ambulatory Visit (HOSPITAL_BASED_OUTPATIENT_CLINIC_OR_DEPARTMENT_OTHER): Payer: Medicare Other

## 2012-10-22 ENCOUNTER — Encounter (HOSPITAL_COMMUNITY): Payer: Self-pay

## 2012-10-22 DIAGNOSIS — K8020 Calculus of gallbladder without cholecystitis without obstruction: Secondary | ICD-10-CM | POA: Insufficient documentation

## 2012-10-22 DIAGNOSIS — I700 Atherosclerosis of aorta: Secondary | ICD-10-CM | POA: Insufficient documentation

## 2012-10-22 DIAGNOSIS — K5730 Diverticulosis of large intestine without perforation or abscess without bleeding: Secondary | ICD-10-CM | POA: Insufficient documentation

## 2012-10-22 DIAGNOSIS — M439 Deforming dorsopathy, unspecified: Secondary | ICD-10-CM | POA: Insufficient documentation

## 2012-10-22 DIAGNOSIS — K449 Diaphragmatic hernia without obstruction or gangrene: Secondary | ICD-10-CM | POA: Insufficient documentation

## 2012-10-22 DIAGNOSIS — I517 Cardiomegaly: Secondary | ICD-10-CM | POA: Insufficient documentation

## 2012-10-22 DIAGNOSIS — C189 Malignant neoplasm of colon, unspecified: Secondary | ICD-10-CM | POA: Insufficient documentation

## 2012-10-22 MED ORDER — IOHEXOL 300 MG/ML  SOLN
100.0000 mL | Freq: Once | INTRAMUSCULAR | Status: AC | PRN
  Administered 2012-10-22: 100 mL via INTRAVENOUS

## 2012-10-24 ENCOUNTER — Telehealth: Payer: Self-pay | Admitting: Oncology

## 2012-10-24 NOTE — Telephone Encounter (Signed)
Called pt with results of CT scan. No concerns voiced.

## 2013-03-31 ENCOUNTER — Encounter: Payer: Self-pay | Admitting: *Deleted

## 2013-03-31 ENCOUNTER — Encounter: Payer: Self-pay | Admitting: Cardiology

## 2013-03-31 DIAGNOSIS — C801 Malignant (primary) neoplasm, unspecified: Secondary | ICD-10-CM | POA: Insufficient documentation

## 2013-03-31 DIAGNOSIS — M1990 Unspecified osteoarthritis, unspecified site: Secondary | ICD-10-CM | POA: Insufficient documentation

## 2013-03-31 DIAGNOSIS — I10 Essential (primary) hypertension: Secondary | ICD-10-CM | POA: Insufficient documentation

## 2013-03-31 DIAGNOSIS — G709 Myoneural disorder, unspecified: Secondary | ICD-10-CM | POA: Insufficient documentation

## 2013-03-31 DIAGNOSIS — E785 Hyperlipidemia, unspecified: Secondary | ICD-10-CM | POA: Insufficient documentation

## 2013-04-01 ENCOUNTER — Encounter: Payer: Self-pay | Admitting: Cardiology

## 2013-04-01 DIAGNOSIS — I2510 Atherosclerotic heart disease of native coronary artery without angina pectoris: Secondary | ICD-10-CM | POA: Insufficient documentation

## 2013-04-02 ENCOUNTER — Encounter (INDEPENDENT_AMBULATORY_CARE_PROVIDER_SITE_OTHER): Payer: Self-pay

## 2013-04-02 ENCOUNTER — Ambulatory Visit (INDEPENDENT_AMBULATORY_CARE_PROVIDER_SITE_OTHER): Payer: Medicare Other | Admitting: Cardiology

## 2013-04-02 ENCOUNTER — Encounter: Payer: Self-pay | Admitting: Cardiology

## 2013-04-02 VITALS — BP 128/56 | HR 74 | Ht 72.0 in | Wt 192.0 lb

## 2013-04-02 DIAGNOSIS — E785 Hyperlipidemia, unspecified: Secondary | ICD-10-CM

## 2013-04-02 DIAGNOSIS — I10 Essential (primary) hypertension: Secondary | ICD-10-CM

## 2013-04-02 DIAGNOSIS — I779 Disorder of arteries and arterioles, unspecified: Secondary | ICD-10-CM

## 2013-04-02 DIAGNOSIS — I2510 Atherosclerotic heart disease of native coronary artery without angina pectoris: Secondary | ICD-10-CM

## 2013-04-02 MED ORDER — AMLODIPINE BESYLATE 5 MG PO TABS: 5.0000 mg | ORAL_TABLET | Freq: Every day | ORAL | Status: DC

## 2013-04-02 MED ORDER — DOXAZOSIN MESYLATE 2 MG PO TABS: 2.0000 mg | ORAL_TABLET | Freq: Every day | ORAL | Status: DC

## 2013-04-02 MED ORDER — HYDROCHLOROTHIAZIDE 25 MG PO TABS: 25.0000 mg | ORAL_TABLET | Freq: Every day | ORAL | Status: DC

## 2013-04-02 MED ORDER — POTASSIUM CHLORIDE CRYS ER 10 MEQ PO TBCR: 20.0000 meq | EXTENDED_RELEASE_TABLET | Freq: Every day | ORAL | Status: DC

## 2013-04-02 NOTE — Progress Notes (Signed)
[address] [place], [place]  [address] Phone: [phone] Fax:  [phone]  Date:  04/02/2013   ID:  Ronnie Robinson, DOB 1931-08-07, MRN [id number]  PCP:  Neldon Labella, MD  Cardiologist:  Armanda Magic, MD     History of Present Illness: Ronnie Robinson is a 79 y.o. male with a history of coronary artery calcifications and no angina, HTN, dyslipidemia and carotid artery stenosis who is doing well.  He denies any chest pain, SOB, DOE, , dizziness, palpitations or syncope.  He has chronic back pain despite having back pain last winter.  He has chronic LE edema which is stable on diuretics.   Wt Readings from Last 3 Encounters:  07/22/12 198 lb 11.2 oz (90.13 kg)  07/22/12 198 lb 11.2 oz (90.13 kg)  07/19/12 198 lb 11.2 oz (90.13 kg)     Past Medical History  Diagnosis Date  . Hypertension   . Hyperlipidemia     coronary artery calcification on Chest CT's goal LDL <70  . Neuromuscular disorder   . Cancer dx'd 12/2009    COLON  . Arthritis   . Anemia     unknown etiologysince 2007 Dr. Arlyce Dice Nl CBC 6/11, ? Anemia of chronic disease  . Osteoarthritis   . Thyroid nodule      cystic and solid nodules 12/11, f/u in 6-12 mos;9/12 Stable B nodules, left mid lobe with nodule with calc 7mm nodule, consider bx, per D.r Sharl Ma, recheck u/s in 6-12 mos 3/13-9/13; 5/13 Stable nodules, thyroid slightly smaller  . Paresthesia   . Edema   . Lumbar degenerative disc disease   . 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 Arlyce Dice, Dr.Gross, margins neg, nodes neg 13/13, Dr. Dalene Carrow  . Carotid artery stenosis      40-50% right and 50-70% left 09/2011  . CAD (coronary artery disease)     coronary artery calcifications on chest CT, nuclear stress test 2013 with no ischemia  . Erectile dysfunction   . Obesity (BMI 30-39.9)   . Pulmonary nodule   . Sigmoid diverticulosis     Current Outpatient Prescriptions  Medication Sig Dispense Refill  .  potassium chloride (K-DUR,KLOR-CON) 10 MEQ tablet Take 20 mEq by mouth daily.      Marland Kitchen amLODipine (NORVASC) 5 MG tablet Take 5 mg by mouth daily.       . Ascorbic Acid (VITAMIN C WITH ROSE HIPS) 500 MG tablet Take 500 mg by mouth daily.       Marland Kitchen aspirin 325 MG tablet Take 325 mg by mouth daily.        Marland Kitchen atorvastatin (LIPITOR) 40 MG tablet Take 40 mg by mouth daily.       . cyclobenzaprine (FLEXERIL) 10 MG tablet Take 1 tablet (10 mg total) by mouth 3 (three) times daily as needed for muscle spasms.  50 tablet  1  . doxazosin (CARDURA) 2 MG tablet Take 2 mg by mouth at bedtime.      . fish oil-omega-3 fatty acids 1000 MG capsule Take 3 g by mouth 2 (two) times daily.       . hydrALAZINE (APRESOLINE) 100 MG tablet Take 100 mg by mouth 2 (two) times daily.       . hydrochlorothiazide (HYDRODIURIL) 25 MG tablet Take 25 mg by mouth daily.      Marland Kitchen HYDROcodone-acetaminophen (NORCO/VICODIN) 5-325 MG per tablet Take 1-2 tablets by mouth every 4 (four) hours as  needed.  41 tablet  0  . Misc Natural Products (OSTEO BI-FLEX ADV JOINT SHIELD PO) Take 1 tablet by mouth 2 (two) times daily.       . Multiple Vitamin (MULTIVITAMIN WITH MINERALS) TABS Take 1 tablet by mouth daily.      . Multiple Vitamins-Minerals (OCUVITE-LUTEIN PO) Take 1 tablet by mouth daily.       . pimecrolimus (ELIDEL) 1 % cream Apply 1 application topically as needed. For rosacea      . psyllium (METAMUCIL) 58.6 % powder Take 1 packet by mouth daily.       . vitamin B-12 (CYANOCOBALAMIN) 100 MCG tablet Take 100 mcg by mouth daily.       No current facility-administered medications for this visit.    Allergies:    Allergies  Allergen Reactions  . Micardis [Telmisartan] Anaphylaxis    Swelling to head, face, and throat.  . 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 His smoking use included Cigarettes. He has a 5 pack-year smoking  history. He has never used smokeless tobacco. He reports that he drinks about 3.5 ounces of alcohol per week. He reports that he does not use illicit drugs.   Family History:  The patient's family history includes Cancer - Prostate in his father; Mastocytosis in an other family member.   ROS:  Please see the history of present illness.      All other systems reviewed and negative.   PHYSICAL EXAM: VS:  There were no vitals taken for this visit. Well nourished, well developed, in no acute distress HEENT: normal Neck: no JVD, faint right carotid artery bruit Cardiac:  normal S1, S2; RRR; no murmur Lungs:  clear to auscultation bilaterally, no wheezing, rhonchi or rales Abd: soft, nontender, no hepatomegaly Ext: no edema Skin: warm and dry Neuro:  CNs 2-12 intact, no focal abnormalities noted      ASSESSMENT AND PLAN:  1. Coronary artery calcifications with no angina and no ischemia on nuclear stress test  - continue ASA 2. HTN - controlled and BMET stable on 01/2013  - continue Amlodipine/doxazosin/hydralazine/HCTZ 3. Dyslipidemia - at goal  - continue fish oil/lipitor  - check fasting lipids and hepatic function in 6 months 4. Moderate Carotid artery stenosis on ASA - last scan 10/2012  - repeat carotid arterial dopplers  Followup with me in 6 months  Signed, Armanda Magic, MD 04/02/2013 10:15 AM

## 2013-04-02 NOTE — Patient Instructions (Signed)
Your physician recommends that you continue on your current medications as directed. Please refer to the Current Medication list given to you today.  Your physician recommends that you return for lab work in: 6 months for Lipid and Hepatic Panel (09/2013) Schedule today when you leave  Your physician has requested that you have a carotid duplex. This test is an ultrasound of the carotid arteries in your neck. It looks at blood flow through these arteries that supply the brain with blood. Allow one hour for this exam. There are no restrictions or special instructions. You will schedule this when you leave today.   We refilled your medications for you at the [facility] as requested  Your physician wants you to follow-up in: 6 months with Dr. Sherlyn Lick will receive a reminder letter in the mail two months in advance. If you don't receive a letter, please call our office to schedule the follow-up appointment.

## 2013-04-04 ENCOUNTER — Encounter: Payer: Self-pay | Admitting: Cardiology

## 2013-04-09 ENCOUNTER — Ambulatory Visit (HOSPITAL_COMMUNITY): Payer: Medicare Other

## 2013-04-14 ENCOUNTER — Other Ambulatory Visit: Payer: Self-pay | Admitting: Dermatology

## 2013-04-24 ENCOUNTER — Ambulatory Visit (HOSPITAL_COMMUNITY): Payer: Medicare Other | Attending: Cardiology

## 2013-04-24 DIAGNOSIS — I6529 Occlusion and stenosis of unspecified carotid artery: Secondary | ICD-10-CM | POA: Insufficient documentation

## 2013-04-24 DIAGNOSIS — I658 Occlusion and stenosis of other precerebral arteries: Secondary | ICD-10-CM | POA: Insufficient documentation

## 2013-04-24 DIAGNOSIS — Z87891 Personal history of nicotine dependence: Secondary | ICD-10-CM | POA: Insufficient documentation

## 2013-04-24 DIAGNOSIS — I10 Essential (primary) hypertension: Secondary | ICD-10-CM | POA: Insufficient documentation

## 2013-04-24 DIAGNOSIS — E785 Hyperlipidemia, unspecified: Secondary | ICD-10-CM | POA: Insufficient documentation

## 2013-04-24 DIAGNOSIS — I771 Stricture of artery: Secondary | ICD-10-CM | POA: Insufficient documentation

## 2013-04-25 ENCOUNTER — Telehealth: Payer: Self-pay | Admitting: General Surgery

## 2013-04-25 NOTE — Telephone Encounter (Signed)
Pt is aware. Pt needs to repeat in 6 Months. Ordered.

## 2013-04-25 NOTE — Telephone Encounter (Signed)
Message copied by Nita Sells on Fri Apr 25, 2013  4:38 PM ------      Message from: Armanda Magic R      Created: Fri Apr 25, 2013  4:24 PM       Progression of right ICA stenosis.  Now patient has 60-79% bilateral carotid artery stenosis.  Please repeat carotid dopplers in 6 months.  Continue ASA ------

## 2013-04-28 ENCOUNTER — Encounter: Payer: Self-pay | Admitting: General Surgery

## 2013-04-29 ENCOUNTER — Other Ambulatory Visit (HOSPITAL_BASED_OUTPATIENT_CLINIC_OR_DEPARTMENT_OTHER): Payer: Medicare Other | Admitting: Lab

## 2013-04-29 ENCOUNTER — Telehealth: Payer: Self-pay | Admitting: Oncology

## 2013-04-29 ENCOUNTER — Ambulatory Visit (HOSPITAL_BASED_OUTPATIENT_CLINIC_OR_DEPARTMENT_OTHER): Payer: Medicare Other | Admitting: Oncology

## 2013-04-29 VITALS — BP 138/59 | HR 65 | Temp 97.0°F | Resp 20 | Ht 72.0 in | Wt 195.0 lb

## 2013-04-29 DIAGNOSIS — C182 Malignant neoplasm of ascending colon: Secondary | ICD-10-CM

## 2013-04-29 DIAGNOSIS — C189 Malignant neoplasm of colon, unspecified: Secondary | ICD-10-CM

## 2013-04-29 DIAGNOSIS — E785 Hyperlipidemia, unspecified: Secondary | ICD-10-CM

## 2013-04-29 DIAGNOSIS — I10 Essential (primary) hypertension: Secondary | ICD-10-CM

## 2013-04-29 LAB — CBC WITH DIFFERENTIAL/PLATELET
Basophils Absolute: 0 10*3/uL (ref 0.0–0.1)
EOS%: 0.7 % (ref 0.0–7.0)
HCT: 36.8 % — ABNORMAL LOW (ref 38.4–49.9)
HGB: 12.8 g/dL — ABNORMAL LOW (ref 13.0–17.1)
LYMPH%: 16.1 % (ref 14.0–49.0)
MCH: 33.3 pg (ref 27.2–33.4)
MCV: 96.3 fL (ref 79.3–98.0)
MONO%: 7.4 % (ref 0.0–14.0)
NEUT%: 74.8 % (ref 39.0–75.0)
Platelets: 227 10*3/uL (ref 140–400)
RBC: 3.83 10*6/uL — ABNORMAL LOW (ref 4.20–5.82)
lymph#: 0.8 10*3/uL — ABNORMAL LOW (ref 0.9–3.3)

## 2013-04-29 LAB — COMPREHENSIVE METABOLIC PANEL (CC13)
ALT: 28 U/L (ref 0–55)
AST: 31 U/L (ref 5–34)
Alkaline Phosphatase: 71 U/L (ref 40–150)
BUN: 16.3 mg/dL (ref 7.0–26.0)
Calcium: 9.8 mg/dL (ref 8.4–10.4)
Chloride: 100 mEq/L (ref 98–109)
Creatinine: 0.9 mg/dL (ref 0.7–1.3)
Glucose: 110 mg/dl (ref 70–140)
Total Bilirubin: 1.39 mg/dL — ABNORMAL HIGH (ref 0.20–1.20)

## 2013-04-29 NOTE — Telephone Encounter (Signed)
gv and printed appt sched and avs for pt fro May 2015 ...gv pt barium

## 2013-04-29 NOTE — Progress Notes (Signed)
Hematology and Oncology Follow Up Visit  Ronnie Robinson [id number] [birth year] 79 y.o. 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  Prior Therapy: S/P laparoscopic-assisted right hemicolectomy on 12/16/09.  Current therapy: Watchful observation  Interim History:  Ronnie Robinson returns for routine follow-up by himself. Since his last visit, he is not reporting any new symptoms. He denies abdominal pain, nausea, and vomiting. No change in his bowel habits. No bleeding noted. Appetite is good. He is gaining weight. No chest pain, shortness of breath, or dyspnea.   Medications: I have reviewed the patient's current medications.  Current Outpatient Prescriptions  Medication Sig Dispense Refill  . amLODipine (NORVASC) 5 MG tablet Take 1 tablet (5 mg total) by mouth daily.  90 tablet  3  . Ascorbic Acid (VITAMIN C WITH ROSE HIPS) 500 MG tablet Take 500 mg by mouth daily.       Marland Kitchen aspirin 325 MG tablet Take 325 mg by mouth daily.        Marland Kitchen atorvastatin (LIPITOR) 40 MG tablet Take 40 mg by mouth daily.       . cyclobenzaprine (FLEXERIL) 10 MG tablet Take 1 tablet (10 mg total) by mouth 3 (three) times daily as needed for muscle spasms.  50 tablet  1  . doxazosin (CARDURA) 2 MG tablet Take 1 tablet (2 mg total) by mouth at bedtime.  90 tablet  3  . fish oil-omega-3 fatty acids 1000 MG capsule Take 3 g by mouth 2 (two) times daily.       . hydrALAZINE (APRESOLINE) 100 MG tablet Take 100 mg by mouth 2 (two) times daily.       . hydrochlorothiazide (HYDRODIURIL) 25 MG tablet Take 1 tablet (25 mg total) by mouth daily.  90 tablet  3  . Misc Natural Products (OSTEO BI-FLEX ADV JOINT SHIELD PO) Take 1 tablet by mouth 2 (two) times daily.       . Multiple Vitamin (MULTIVITAMIN WITH MINERALS) TABS Take 1 tablet by mouth daily.      . Multiple Vitamins-Minerals (OCUVITE-LUTEIN PO)  Take 1 tablet by mouth daily.       . pimecrolimus (ELIDEL) 1 % cream Apply 1 application topically as needed. For rosacea      . potassium chloride (K-DUR,KLOR-CON) 10 MEQ tablet Take 2 tablets (20 mEq total) by mouth daily.  180 tablet  3  . psyllium (METAMUCIL) 58.6 % powder Take 1 packet by mouth daily.       . vitamin B-12 (CYANOCOBALAMIN) 100 MCG tablet Take 100 mcg by mouth daily.       No current facility-administered medications for this visit.     Allergies:  Allergies  Allergen Reactions  . Micardis [Telmisartan] Anaphylaxis    Swelling to head, face, and throat.  . Metoprolol     Type I second degree AV block with beta blockers    Past Medical History, Surgical history, Social history, and Family History were reviewed and updated.  Review of Systems:  Remaining ROS negative.  Physical Exam: Blood pressure 138/59, pulse 65, temperature 97 F (36.1 C), temperature source Oral, resp. rate 20, height 6' (1.829 m), weight 195 lb (88.451 kg). ECOG: 0 General appearance: alert, cooperative and no distress Head: Normocephalic, without obvious abnormality, atraumatic Neck: no adenopathy, no carotid bruit, no JVD, supple, symmetrical, trachea midline and thyroid not enlarged, symmetric, no tenderness/mass/nodules Lymph nodes:  Cervical, supraclavicular, and axillary nodes normal. Heart:regular rate and rhythm, S1, S2 normal, no murmur, click, rub or gallop Lung:chest clear, no wheezing, rales, normal symmetric air entry, no tachypnea, retractions or cyanosis Abdomen: soft, non-tender, without masses or organomegaly EXT:no erythema, induration, or nodules   Lab Results: Lab Results  Component Value Date   WBC 5.1 04/29/2013   HGB 12.8* 04/29/2013   HCT 36.8* 04/29/2013   MCV 96.3 04/29/2013   PLT 227 04/29/2013     Chemistry      Component Value Date/Time   NA 135 07/19/2012 1550   NA 138 07/16/2012 1151   NA 138 10/23/2011 1138   K 3.3* 07/19/2012 1550   K 3.3*  07/16/2012 1151   K 4.0 10/23/2011 1138   CL 96 07/19/2012 1550   CL 98 07/16/2012 1151   CL 97* 10/23/2011 1138   CO2 30 07/19/2012 1550   CO2 28 07/16/2012 1151   CO2 27 10/23/2011 1138   BUN 14.4 10/22/2012 0908   BUN 18 07/19/2012 1550   BUN 16 10/23/2011 1138   CREATININE 0.9 10/22/2012 0908   CREATININE 0.89 07/19/2012 1550   CREATININE 0.8 10/23/2011 1138      Component Value Date/Time   CALCIUM 9.8 07/19/2012 1550   CALCIUM 9.4 07/16/2012 1151   CALCIUM 8.9 10/23/2011 1138   ALKPHOS 67 07/16/2012 1151   ALKPHOS 56 10/23/2011 1138   ALKPHOS 67 03/28/2011 1510   AST 25 07/16/2012 1151   AST 33 10/23/2011 1138   AST 28 03/28/2011 1510   ALT 28 07/16/2012 1151   ALT 32 10/23/2011 1138   ALT 29 03/28/2011 1510   BILITOT 1.92* 07/16/2012 1151   BILITOT 1.50 10/23/2011 1138   BILITOT 1.1 03/28/2011 1510     Impression and Plan: This is a 79 year old gentleman with the following issues:  1. Stage IIA colon cancer diagnosed in 2011. S/P laparoscopic-assisted hemicolectomy of the right colon. His last CT scan was done May of 2014 and showed no evidence of disease. The plan is to continue active surveillance at a followup in 6 months time and a CT scan at that time. He will probably need no further followup beyond the summer of 2006 the.  2. HTN. On Norvasc, HCTZ, and Hydralazine per PCP.  3. Hyperlipidemia. On Lipitor per PCP.  4. Follow-up. CT and M.D. followup in 6 months.      [name] 11/18/20141:53 PM

## 2013-04-30 LAB — CEA: CEA: 1 ng/mL (ref 0.0–5.0)

## 2013-09-09 ENCOUNTER — Encounter: Payer: Self-pay | Admitting: Gastroenterology

## 2013-09-15 ENCOUNTER — Other Ambulatory Visit (INDEPENDENT_AMBULATORY_CARE_PROVIDER_SITE_OTHER): Payer: Medicare Other

## 2013-09-15 DIAGNOSIS — I2510 Atherosclerotic heart disease of native coronary artery without angina pectoris: Secondary | ICD-10-CM

## 2013-09-15 LAB — HEPATIC FUNCTION PANEL
ALT: 32 U/L (ref 0–53)
AST: 36 U/L (ref 0–37)
Albumin: 3.7 g/dL (ref 3.5–5.2)
Alkaline Phosphatase: 52 U/L (ref 39–117)
BILIRUBIN TOTAL: 1.4 mg/dL — AB (ref 0.3–1.2)
Bilirubin, Direct: 0.2 mg/dL (ref 0.0–0.3)
Total Protein: 6.6 g/dL (ref 6.0–8.3)

## 2013-09-15 LAB — LIPID PANEL
CHOL/HDL RATIO: 2
Cholesterol: 168 mg/dL (ref 0–200)
HDL: 92.2 mg/dL (ref >39.00)
LDL Cholesterol: 69 mg/dL (ref 0–99)
Triglycerides: 33 mg/dL (ref 0.0–149.0)
VLDL: 6.6 mg/dL (ref 0.0–40.0)

## 2013-09-16 ENCOUNTER — Other Ambulatory Visit: Payer: Self-pay | Admitting: General Surgery

## 2013-09-16 DIAGNOSIS — E785 Hyperlipidemia, unspecified: Secondary | ICD-10-CM

## 2013-10-09 ENCOUNTER — Encounter: Payer: Self-pay | Admitting: Physician Assistant

## 2013-10-09 ENCOUNTER — Ambulatory Visit (INDEPENDENT_AMBULATORY_CARE_PROVIDER_SITE_OTHER): Payer: Medicare Other | Admitting: Physician Assistant

## 2013-10-09 ENCOUNTER — Telehealth: Payer: Self-pay | Admitting: *Deleted

## 2013-10-09 ENCOUNTER — Ambulatory Visit
Admission: RE | Admit: 2013-10-09 | Discharge: 2013-10-09 | Disposition: A | Discharge status: Home / Self Care | Payer: Medicare Other | Source: Ambulatory Visit | Attending: Physician Assistant | Admitting: Physician Assistant

## 2013-10-09 ENCOUNTER — Telehealth: Payer: Self-pay | Admitting: Pediatrics

## 2013-10-09 VITALS — BP 150/72 | HR 67 | Ht 71.0 in | Wt 192.8 lb

## 2013-10-09 DIAGNOSIS — I2510 Atherosclerotic heart disease of native coronary artery without angina pectoris: Secondary | ICD-10-CM

## 2013-10-09 DIAGNOSIS — I779 Disorder of arteries and arterioles, unspecified: Secondary | ICD-10-CM

## 2013-10-09 DIAGNOSIS — R079 Chest pain, unspecified: Secondary | ICD-10-CM

## 2013-10-09 DIAGNOSIS — I739 Peripheral vascular disease, unspecified: Secondary | ICD-10-CM

## 2013-10-09 DIAGNOSIS — I10 Essential (primary) hypertension: Secondary | ICD-10-CM

## 2013-10-09 NOTE — Telephone Encounter (Signed)
New message     Talk to the nurse regarding chest pain he has had. No active pain now.

## 2013-10-09 NOTE — Telephone Encounter (Signed)
I spoke with the pt and about 3 weeks ago he noticed a "minor" pain in the center of his chest.  This comes and goes and is not related to any specific activity, more noticeable at rest.  The pt further describes this as a dull ache and rates it as 4-5/10.  The pt also said that the "ache feels external rather than internal".  The pt denies SOB and nausea with pain. The pt cannot reproduce pain when pressing in the center of chest. The pt saw his PCP last week and they recommended that he continue to monitor symptoms. The pt will be traveling soon and he would like to be evaluated from a cardiology standpoint.  I have scheduled the pt to see Ronnie Copa PA today. Pt agreed with plan.

## 2013-10-09 NOTE — Progress Notes (Addendum)
[facility], [address] [place], [place]  [id number] Phone: [phone] Fax:  [phone]  Date:  10/09/2013   Patient ID:  Ronnie Robinson, DOB 16-Sep-1931, MRN [id number]   PCP:  Tawanna Solo, MD  Cardiologist:  Dr. Radford Pax  History of Present Illness: Ronnie Robinson is a 80 y.o. male with history of coronary artery calcifications by CT 2013 (neg nuc at that time), HTN, HTN, dyslipidemia, carotid artery disease (60-79% BICA) who presents for evaluation of chest pain. For the past 3 weeks he has had an intermittent substernal chest discomfort that he describes as "uneasiness." It feels like a dull ache, not necessarily pressure or sharp. It can last anywhere from minutes to hours. Sometimes it happens multiple times a day, sometimes not at all. He did not have any pain yesterday, but felt it this morning. It is not worse with exertion, inspiration, palpation, or associated with meals. It is not associated with SOB, nausea, diaphoresis, palpitations or any other symptoms. He feels like it is on the surface rather than deeply internally but cannot reproduce the pain with palpation. He has not taken anything for the pain. He describes it as a minor discomfort. He wanted to get checked out because he leaves for the summer for [place] in the beginning of June. He wonders if this is related to anxiety.  BP in office 150/72 which he states is higher than usual for him. Recheck after several minutes of resting quietly was 140/62.   Recent Labs: 04/29/2013: Creatinine 0.9; Hemoglobin 12.8*; Potassium 3.7  09/15/2013: ALT 32; HDL Cholesterol 92.20; LDL (calc) 69   Wt Readings from Last 3 Encounters:  10/09/13 192 lb 12.8 oz (87.454 kg)  04/29/13 195 lb (88.451 kg)  04/02/13 192 lb (87.091 kg)     Past Medical History  Diagnosis Date  . Hypertension   . Hyperlipidemia     coronary artery calcification on Chest CT's goal LDL <70  . Neuromuscular disorder   . Colon cancer dx'd 12/2009   a. 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 CT scan 10/2012 no evidence of disease.  . Arthritis   . Anemia     unknown etiology since 2007 Dr. Deatra Ina Nl CBC 6/11, ? Anemia of chronic disease  . Osteoarthritis   . Thyroid nodule      cystic and solid nodules 12/11, f/u in 6-12 mos;9/12 Stable B nodules, left mid lobe with nodule with calc 59mm nodule, consider bx, per D.r Buddy Duty, recheck u/s in 6-12 mos 3/13-9/13; 5/13 Stable nodules, thyroid slightly smaller  . Paresthesia   . Edema   . Lumbar degenerative disc disease   . 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 Deatra Ina, Dr.Gross, margins neg, nodes neg 13/13, Dr. Jamse Arn  . Carotid artery stenosis     a. 50-69% BICA by duplex 04/2013 - additionally, mild RECA stenosis, mild bilateral subclavian artery stenosis.  Marland Kitchen CAD (coronary artery disease)     a. Coronary artery calcifications on chest CT 2013 incidentally noted. b. nuclear stress test 2013 with no ischemia  . Erectile dysfunction   . Obesity (BMI 30-39.9)   . Pulmonary nodule   . Sigmoid diverticulosis   . 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    Current Outpatient Prescriptions  Medication Sig Dispense Refill  . amLODipine (NORVASC) 5 MG tablet Take  1 tablet (5 mg total) by mouth daily.  90 tablet  3  . Ascorbic Acid (VITAMIN C WITH ROSE HIPS) 500 MG tablet Take 500 mg by mouth daily.       Marland Kitchen aspirin 325 MG tablet Take 325 mg by mouth daily.        Marland Kitchen atorvastatin (LIPITOR) 40 MG tablet Take 40 mg by mouth daily.       Marland Kitchen doxazosin (CARDURA) 2 MG tablet Take 1 tablet (2 mg total) by mouth at bedtime.  90 tablet  3  . fish oil-omega-3 fatty acids 1000 MG capsule Take 3 g by mouth 2 (two) times daily.       . hydrALAZINE (APRESOLINE) 100 MG tablet Take 100 mg by mouth 2 (two) times daily.       . hydrochlorothiazide (HYDRODIURIL) 25 MG tablet Take 1  tablet (25 mg total) by mouth daily.  90 tablet  3  . Misc Natural Products (OSTEO BI-FLEX ADV JOINT SHIELD PO) Take 1 tablet by mouth 2 (two) times daily.       . Multiple Vitamin (MULTIVITAMIN WITH MINERALS) TABS Take 1 tablet by mouth daily.      . Multiple Vitamins-Minerals (OCUVITE-LUTEIN PO) Take 1 tablet by mouth daily.       . pimecrolimus (ELIDEL) 1 % cream Apply 1 application topically as needed. For rosacea      . potassium chloride (K-DUR,KLOR-CON) 10 MEQ tablet Take 2 tablets (20 mEq total) by mouth daily.  180 tablet  3  . psyllium (METAMUCIL) 58.6 % powder Take 1 packet by mouth daily.       . vitamin B-12 (CYANOCOBALAMIN) 100 MCG tablet Take 100 mcg by mouth daily.       No current facility-administered medications for this visit.    Allergies:   Micardis and Metoprolol   Social History:  The patient  reports that he quit smoking about 55 years ago. His smoking use included Cigarettes. He has a 5 pack-year smoking history. He has never used smokeless tobacco. He reports that he drinks about 3.5 ounces of alcohol per week. He reports that he does not use illicit drugs.   Family History:  The patient's family history includes Cancer - Prostate in his father; Mastocytosis in an other family member.  ROS:  Please see the history of present illness.    All other systems reviewed and negative.   PHYSICAL EXAM:  VS:  BP 150/72  Pulse 67  Ht 5\' 11"  (1.803 m)  Wt 192 lb 12.8 oz (87.454 kg)  BMI 26.90 kg/m2 Well nourished, well developed WM in no acute distress HEENT: normal Neck: no JVD Cardiac:  normal S1, S2; RRR; no murmur Lungs:  clear to auscultation bilaterally, no wheezing, rhonchi or rales Abd: soft, nontender, no hepatomegaly Ext: no edema Skin: warm and dry Neuro:  moves all extremities spontaneously, no focal abnormalities noted  EKG:  NSR 67bpm 1st degree AVB (PR 294), TWI avL, otherwise no acute ST-T changes     ASSESSMENT AND PLAN:  1. Chest pain - mostly  atypical features and EKG nonacute, but he does have cardiac risk factors including coronary artery calcifications, dyslipidemia, HTN, and carotid disease. Will plan exercise nuclear stress test to risk stratify. Continue aspirin. Return to ER precautions reviewed. Will also check CXR to rule out any unusual findings in light of h/o colon CA. 2. Coronary artery disease - as above. Continue aspirin and statin. Not on BB due to h/o 2nd degree  type I AVB with metoprolol in the past. 3. HTN - pt will keep a BP log over 1 week and call with those findings. We will consider increasing Norvasc if tendency is to be elevated. 4. Carotid artery disease - f/u duplex as planned. Have asked nurse to help make sure this is scheduled.  Dispo: pending results of stress test. If abnormal, will need discussion about cath. If normal, can follow up with Dr. Radford Pax as previously planned.  Signed, Melina Copa, PA-C  10/09/2013 4:50 PM

## 2013-10-09 NOTE — Patient Instructions (Signed)
Your physician has requested that you have en exercise stress myoview on May 19th at 9:15 am  For further information please visit [email or web address]. Please follow instruction sheet, as given.  A chest x-ray takes a picture of the organs and structures inside the chest, including the heart, lungs, and blood vessels. This test can show several things, including, whether the heart is enlarges; whether fluid is building up in the lungs; and whether pacemaker / defibrillator leads are still in place. [facility]   Keep log of Blood Pressure over one week and call our office at [phone] and talk with triage and give them a list of all your recordings and have them contact [facility]  Patient was given a copy of EKG today

## 2013-10-13 ENCOUNTER — Ambulatory Visit (HOSPITAL_COMMUNITY)
Admission: RE | Admit: 2013-10-13 | Discharge: 2013-10-13 | Disposition: A | Discharge status: Home / Self Care | Payer: Medicare Other | Source: Ambulatory Visit | Attending: Oncology | Admitting: Oncology

## 2013-10-13 ENCOUNTER — Other Ambulatory Visit (HOSPITAL_BASED_OUTPATIENT_CLINIC_OR_DEPARTMENT_OTHER): Payer: Medicare Other

## 2013-10-13 ENCOUNTER — Encounter (HOSPITAL_COMMUNITY): Payer: Self-pay

## 2013-10-13 DIAGNOSIS — C182 Malignant neoplasm of ascending colon: Secondary | ICD-10-CM

## 2013-10-13 DIAGNOSIS — C189 Malignant neoplasm of colon, unspecified: Secondary | ICD-10-CM

## 2013-10-13 DIAGNOSIS — Z85038 Personal history of other malignant neoplasm of large intestine: Secondary | ICD-10-CM | POA: Insufficient documentation

## 2013-10-13 LAB — COMPREHENSIVE METABOLIC PANEL (CC13)
ALBUMIN: 3.7 g/dL (ref 3.5–5.0)
ALK PHOS: 59 U/L (ref 40–150)
ALT: 22 U/L (ref 0–55)
ANION GAP: 10 meq/L (ref 3–11)
AST: 27 U/L (ref 5–34)
BUN: 11.8 mg/dL (ref 7.0–26.0)
CO2: 26 meq/L (ref 22–29)
Calcium: 9.7 mg/dL (ref 8.4–10.4)
Chloride: 98 mEq/L (ref 98–109)
Creatinine: 0.9 mg/dL (ref 0.7–1.3)
Glucose: 108 mg/dl (ref 70–140)
Potassium: 3.4 mEq/L — ABNORMAL LOW (ref 3.5–5.1)
Sodium: 134 mEq/L — ABNORMAL LOW (ref 136–145)
Total Bilirubin: 1.45 mg/dL — ABNORMAL HIGH (ref 0.20–1.20)
Total Protein: 6.5 g/dL (ref 6.4–8.3)

## 2013-10-13 LAB — CBC WITH DIFFERENTIAL/PLATELET
BASO%: 0.7 % (ref 0.0–2.0)
Basophils Absolute: 0 10*3/uL (ref 0.0–0.1)
EOS%: 2.4 % (ref 0.0–7.0)
Eosinophils Absolute: 0.1 10*3/uL (ref 0.0–0.5)
HCT: 37.9 % — ABNORMAL LOW (ref 38.4–49.9)
HGB: 13.4 g/dL (ref 13.0–17.1)
LYMPH%: 25.1 % (ref 14.0–49.0)
MCH: 33.7 pg — ABNORMAL HIGH (ref 27.2–33.4)
MCHC: 35.4 g/dL (ref 32.0–36.0)
MCV: 95.2 fL (ref 79.3–98.0)
MONO#: 0.5 10*3/uL (ref 0.1–0.9)
MONO%: 11.6 % (ref 0.0–14.0)
NEUT%: 60.2 % (ref 39.0–75.0)
NEUTROS ABS: 2.7 10*3/uL (ref 1.5–6.5)
PLATELETS: 232 10*3/uL (ref 140–400)
RBC: 3.98 10*6/uL — ABNORMAL LOW (ref 4.20–5.82)
RDW: 13.9 % (ref 11.0–14.6)
WBC: 4.4 10*3/uL (ref 4.0–10.3)
lymph#: 1.1 10*3/uL (ref 0.9–3.3)

## 2013-10-13 MED ORDER — IOHEXOL 300 MG/ML  SOLN
100.0000 mL | Freq: Once | INTRAMUSCULAR | Status: AC | PRN
  Administered 2013-10-13: 100 mL via INTRAVENOUS

## 2013-10-13 NOTE — Telephone Encounter (Signed)
s/w pt's wife about needing to rsc carotids needing to change time and day of carotids due to scheduling conflict.Marland Kitchen

## 2013-10-15 ENCOUNTER — Encounter: Payer: Self-pay | Admitting: Oncology

## 2013-10-15 ENCOUNTER — Telehealth: Payer: Self-pay | Admitting: Oncology

## 2013-10-15 ENCOUNTER — Ambulatory Visit (HOSPITAL_BASED_OUTPATIENT_CLINIC_OR_DEPARTMENT_OTHER): Payer: Medicare Other | Admitting: Oncology

## 2013-10-15 VITALS — BP 133/47 | HR 70 | Temp 98.3°F | Resp 20 | Ht 71.0 in | Wt 192.5 lb

## 2013-10-15 DIAGNOSIS — C189 Malignant neoplasm of colon, unspecified: Secondary | ICD-10-CM

## 2013-10-15 DIAGNOSIS — Z85038 Personal history of other malignant neoplasm of large intestine: Secondary | ICD-10-CM

## 2013-10-15 NOTE — Progress Notes (Signed)
Hematology and Oncology Follow Up Visit  Ronnie Robinson [id number] 29-Apr-1932 80 y.o. 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  Prior Therapy: S/P laparoscopic-assisted right hemicolectomy on 12/16/09.  Current therapy: Watchful observation  Interim History:  Ronnie Robinson returns for routine follow-up by himself. Since his last visit, he has been doing very well without any new complications. 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 He is not reporting any new symptoms. He denies abdominal pain, nausea, and vomiting. No change in his bowel habits. No bleeding noted. Appetite is good. His weight is stable at this time. No chest pain, shortness of breath, or dyspnea on exertion. He has not reported any recent hospitalizations or illnesses. He has not reported any neurological symptoms of headaches or blurry vision. Has not reported any alteration of mental status. He has not reported any shortness of breath or cough or hemoptysis. Has not reported any orthopnea or palpitations.  Medications: I have reviewed the patient's current medications.  Current Outpatient Prescriptions  Medication Sig Dispense Refill  . amLODipine (NORVASC) 5 MG tablet Take 1 tablet (5 mg total) by mouth daily.  90 tablet  3  . Ascorbic Acid (VITAMIN C WITH ROSE HIPS) 500 MG tablet Take 500 mg by mouth daily.       Marland Kitchen aspirin 325 MG tablet Take 325 mg by mouth daily.        Marland Kitchen atorvastatin (LIPITOR) 40 MG tablet Take 40 mg by mouth daily.       Marland Kitchen doxazosin (CARDURA) 2 MG tablet Take 1 tablet (2 mg total) by mouth at bedtime.  90 tablet  3  . fish oil-omega-3 fatty acids 1000 MG capsule Take 3 g by mouth 2 (two) times daily.       . hydrALAZINE (APRESOLINE) 100 MG tablet Take 100 mg by mouth 2 (two) times daily.       . hydrochlorothiazide (HYDRODIURIL) 25  MG tablet Take 1 tablet (25 mg total) by mouth daily.  90 tablet  3  . Misc Natural Products (OSTEO BI-FLEX ADV JOINT SHIELD PO) Take 1 tablet by mouth 2 (two) times daily.       . Multiple Vitamin (MULTIVITAMIN WITH MINERALS) TABS Take 1 tablet by mouth daily.      . Multiple Vitamins-Minerals (OCUVITE-LUTEIN PO) Take 1 tablet by mouth daily.       . pimecrolimus (ELIDEL) 1 % cream Apply 1 application topically as needed. For rosacea      . potassium chloride (K-DUR,KLOR-CON) 10 MEQ tablet Take 2 tablets (20 mEq total) by mouth daily.  180 tablet  3  . psyllium (METAMUCIL) 58.6 % powder Take 1 packet by mouth daily.       . vitamin B-12 (CYANOCOBALAMIN) 100 MCG tablet Take 100 mcg by mouth daily.       No current facility-administered medications for this visit.     Allergies:  Allergies  Allergen Reactions  . Micardis [Telmisartan] Anaphylaxis    Swelling to head, face, and throat.  . Metoprolol     Type I second degree AV block with beta blockers    Past Medical History, Surgical history, Social history, and Family History were reviewed and updated.  Review of Systems:  Remaining ROS negative.  Physical Exam: Blood pressure 133/47, pulse 70, temperature 98.3 F (36.8 C), temperature source Oral, resp. rate 20,  height 5\' 11"  (1.803 m), weight 192 lb 8 oz (87.317 kg), SpO2 97.00%. ECOG: 0 General appearance: alert, cooperative and no distress Head: Normocephalic, without obvious abnormality, atraumatic Neck: no adenopathy, no carotid bruit, no JVD, supple, symmetrical, trachea midline and thyroid not enlarged, symmetric, no tenderness/mass/nodules Lymph nodes: Cervical, supraclavicular, and axillary nodes normal. Heart:regular rate and rhythm, S1, S2 normal, no murmur, click, rub or gallop Lung:chest clear, no wheezing, rales, normal symmetric air entry, no tachypnea, retractions or cyanosis Abdomen: soft, non-tender, without masses or organomegaly EXT:no erythema, induration,  or nodules   Lab Results: Lab Results  Component Value Date   WBC 4.4 10/13/2013   HGB 13.4 10/13/2013   HCT 37.9* 10/13/2013   MCV 95.2 10/13/2013   PLT 232 10/13/2013     Chemistry      Component Value Date/Time   NA 134* 10/13/2013 1009   NA 135 07/19/2012 1550   NA 138 10/23/2011 1138   K 3.4* 10/13/2013 1009   K 3.3* 07/19/2012 1550   K 4.0 10/23/2011 1138   CL 96 07/19/2012 1550   CL 98 07/16/2012 1151   CL 97* 10/23/2011 1138   CO2 26 10/13/2013 1009   CO2 30 07/19/2012 1550   CO2 27 10/23/2011 1138   BUN 11.8 10/13/2013 1009   BUN 18 07/19/2012 1550   BUN 16 10/23/2011 1138   CREATININE 0.9 10/13/2013 1009   CREATININE 0.89 07/19/2012 1550   CREATININE 0.8 10/23/2011 1138      Component Value Date/Time   CALCIUM 9.7 10/13/2013 1009   CALCIUM 9.8 07/19/2012 1550   CALCIUM 8.9 10/23/2011 1138   ALKPHOS 59 10/13/2013 1009   ALKPHOS 52 09/15/2013 0959   ALKPHOS 56 10/23/2011 1138   AST 27 10/13/2013 1009   AST 36 09/15/2013 0959   AST 33 10/23/2011 1138   ALT 22 10/13/2013 1009   ALT 32 09/15/2013 0959   ALT 32 10/23/2011 1138   BILITOT 1.45* 10/13/2013 1009   BILITOT 1.4* 09/15/2013 0959   BILITOT 1.50 10/23/2011 1138      EXAM:  CT ABDOMEN AND PELVIS WITH CONTRAST  TECHNIQUE:  Multidetector CT imaging of the abdomen and pelvis was performed  using the standard protocol following bolus administration of  intravenous contrast.  CONTRAST: 130mL OMNIPAQUE IOHEXOL 300 MG/ML SOLN  COMPARISON: Abdominal pelvic CT 10/22/2012.  FINDINGS:  The lung bases are clear. There is no significant pleural or  pericardial effusion. Coronary artery atherosclerosis is noted.  The liver has a stable appearance without evidence of metastatic  disease. Small calcified gallstones are noted. There is no  gallbladder wall thickening or biliary dilatation. A small cystic  lesion within the pancreatic head on image 30 is stable. The  pancreas otherwise appears normal. There is no pancreatic ductal  dilatation.  Splenic granulomas and  mild fullness of the left adrenal gland are  stable. The right adrenal gland appears normal. Both kidneys appear  normal.  A small hiatal hernia appears stable. The stomach and small bowel  demonstrate no significant findings. There are stable postsurgical  changes status post right hemicolectomy. Sigmoid colon diverticular  changes are present without surrounding inflammation.  Aortoiliac atherosclerosis and small retroperitoneal lymph nodes are  stable. There is no pelvic adenopathy, ascites or peritoneal  nodularity.  Moderate enlargement of the prostate gland is again noted associated  with mild chronic bladder wall thickening.  There are stable postsurgical changes status post L3-5 fusion. No  worrisome osseous findings are demonstrated.  IMPRESSION:  1. Stable examination status post partial colectomy. No evidence of  local recurrence or metastatic disease.  2. Cholelithiasis, moderate enlargement of the prostate gland and  atherosclerosis again noted.    Impression and Plan: This is a 80 year old gentleman with the following issues:  1. Stage IIA colon cancer diagnosed in 2011. S/P laparoscopic-assisted hemicolectomy of the right colon. His last CT scan was done on 10/11/2013 was reviewed today and showed no evidence of disease. The plan is to continue active surveillance at a followup in 12 months time and a CT scan at that time. He will probably need no further followup beyond the summer of 2016.  2. HTN. On Norvasc, HCTZ, and Hydralazine per PCP.  3. Hyperlipidemia. On Lipitor per PCP.  4. Follow-up. CT and M.D. followup in 12 months.      Wyatt Portela 5/6/20154:07 PM

## 2013-10-15 NOTE — Telephone Encounter (Signed)
gv and printed appt sched and avs for pt for May 2016....gv pt barium

## 2013-10-17 ENCOUNTER — Encounter (HOSPITAL_COMMUNITY): Payer: Medicare Other

## 2013-10-20 ENCOUNTER — Telehealth: Payer: Self-pay | Admitting: *Deleted

## 2013-10-20 ENCOUNTER — Ambulatory Visit (HOSPITAL_COMMUNITY): Payer: Medicare Other | Attending: Cardiology | Admitting: Cardiology

## 2013-10-20 ENCOUNTER — Telehealth: Payer: Self-pay | Admitting: Nurse Practitioner

## 2013-10-20 DIAGNOSIS — I779 Disorder of arteries and arterioles, unspecified: Secondary | ICD-10-CM

## 2013-10-20 DIAGNOSIS — I739 Peripheral vascular disease, unspecified: Secondary | ICD-10-CM

## 2013-10-20 DIAGNOSIS — I6529 Occlusion and stenosis of unspecified carotid artery: Secondary | ICD-10-CM | POA: Insufficient documentation

## 2013-10-20 NOTE — Telephone Encounter (Signed)
BPs are acceptable. Let's continue same medications for now. However, if he finds that readings are persistently over 102 systolic in the future, would consider increasing Norvasc. Klyde Banka PA-C

## 2013-10-20 NOTE — Progress Notes (Signed)
Carotid duplex completed 

## 2013-10-20 NOTE — Telephone Encounter (Signed)
10/10/13  9:50 am 144/67  HR 60   9:55 am 124/59  HR 56  10/11/13  3:45 pm 145/69  HR 57   3:50 pm 141/60  HR 60   3:55 pm 143/73  HR 58  10/12/13  9:40 am 137/69  HR 62   9:45 am  128/66 HR 61   11:00 pm 152/71 HR 58   11:03 pm 144/71 HR 52  10/13/13  2:49 pm 143/69  HR 58   2:52 pm 146/63  HR 57  10/15/13  9:20 am 133/68  HR 59   9:23 am132/63  HR 60  10/16/13  12:50 pm 130/67  HR 72   3:40 pm 133/47  HR  70  10/17/13  9:50 am 131/62  HR 62   3:30 pm 126/67  HR 60  10/19/13 5:15 pm 143/70  HR 62   5:30 pm 140/83  HR 58   10:45 pm 131/67  HR 60  10/20/13 10:00 am 111/64  HR 67   10:15 am 119/73  HR 62   11:30 am 117/57  HR 76  Hey Ronnie Robinson hope you are well here is the BP you requested on Ronnie Robinson

## 2013-10-20 NOTE — Telephone Encounter (Signed)
Walk In Pt Form " BP readings" In Sealed Envelope gave to [employer]

## 2013-10-20 NOTE — Telephone Encounter (Signed)
Pt's spouse aware of the outcome of recorded BP readings from [facility]

## 2013-10-21 ENCOUNTER — Other Ambulatory Visit: Payer: Self-pay | Admitting: General Surgery

## 2013-10-21 DIAGNOSIS — I779 Disorder of arteries and arterioles, unspecified: Secondary | ICD-10-CM

## 2013-10-21 DIAGNOSIS — I739 Peripheral vascular disease, unspecified: Principal | ICD-10-CM

## 2013-10-23 ENCOUNTER — Other Ambulatory Visit: Payer: Self-pay | Admitting: Dermatology

## 2013-10-28 ENCOUNTER — Ambulatory Visit (HOSPITAL_COMMUNITY): Payer: Medicare Other | Attending: Cardiovascular Disease | Admitting: Radiology

## 2013-10-28 VITALS — BP 146/68 | HR 57 | Ht 71.0 in | Wt 190.0 lb

## 2013-10-28 DIAGNOSIS — R079 Chest pain, unspecified: Secondary | ICD-10-CM

## 2013-10-28 DIAGNOSIS — I2510 Atherosclerotic heart disease of native coronary artery without angina pectoris: Secondary | ICD-10-CM

## 2013-10-28 MED ORDER — TECHNETIUM TC 99M SESTAMIBI GENERIC - CARDIOLITE
33.0000 | Freq: Once | INTRAVENOUS | Status: AC | PRN
  Administered 2013-10-28: 33 via INTRAVENOUS

## 2013-10-28 MED ORDER — TECHNETIUM TC 99M SESTAMIBI GENERIC - CARDIOLITE
11.0000 | Freq: Once | INTRAVENOUS | Status: AC | PRN
  Administered 2013-10-28: 11 via INTRAVENOUS

## 2013-10-28 NOTE — Progress Notes (Signed)
[facility] [address] [place], [place] [address] [phone]    Cardiology Nuclear Med Study  Ronnie Robinson is a 80 y.o. male     MRN : [id number]     DOB: 1931-12-11  Procedure Date: 10/28/2013  Nuclear Med Background Indication for Stress Test:  Evaluation for Ischemia and Abnormal EKG History:  No known CAD, MPI 2013 (normal) EF 70%, Cardiac CT 2013 (+calcifications) Cardiac Risk Factors: Carotid Disease, History of Smoking, Hypertension and Lipids  Symptoms:  Chest Pain (last date of chest discomfort was earlier today)   Nuclear Pre-Procedure Caffeine/Decaff Intake:  None > 12 hrs NPO After: 7:30pm   Lungs:  clear O2 Sat: 98% on room air. IV 0.9% NS with Angio Cath:  22g  IV Site: R Hand x 1, tolerated well IV Started by:  Irven Baltimore, RN  Chest Size (in):  42 Cup Size: n/a  Height: 5\' 11"  (1.803 m)  Weight:  190 lb (86.183 kg)  BMI:  Body mass index is 26.51 kg/(m^2). Tech Comments:  Took morning medications. Irven Baltimore, RN.    Nuclear Med Study 1 or 2 day study: 1 day  Stress Test Type:  Stress  Reading MD: N/A  Order Authorizing Provider:  Fransico Him, MD, and Melina Copa, PAC  Resting Radionuclide: Technetium 10m Sestamibi  Resting Radionuclide Dose: 11.0 mCi   Stress Radionuclide:  Technetium 16m Sestamibi  Stress Radionuclide Dose: 33.0 mCi           Stress Protocol Rest HR: 57 Stress HR: 151  Rest BP: 146/68 Stress BP: 164/60  Exercise Time (min): 4:30 METS: 6.4           Dose of Adenosine (mg):  n/a Dose of Lexiscan: n/a mg  Dose of Atropine (mg): n/a Dose of Dobutamine: n/a mcg/kg/min (at max HR)  Stress Test Technologist: Glade Lloyd, BS-ES  Nuclear Technologist:  Charlton Amor, CNMT     Rest Procedure:  Myocardial perfusion imaging was performed at rest 45 minutes following the intravenous administration of Technetium 74m Sestamibi. Rest ECG: NSR with non-specific ST-T wave changes  Stress Procedure:  The  patient exercised on the treadmill utilizing the Bruce Protocol for 4:30 minutes. The patient stopped due to fatigue and denied any chest pain.  Technetium 34m Sestamibi was injected at peak exercise and myocardial perfusion imaging was performed after a brief delay. Stress ECG: There were occasional PVCs during exercise with 4 beat run of wide-complex tachycardia at 3 minutes and 18 seconds. She had 1 mm of ST segment depression in lead V5 and V6 at stress, promptly resolving in recovery. PAC noted in recovery.  QPS Raw Data Images:  Mild diaphragmatic attenuation; normal left ventricular size. Stress Images:  There is mild inferior wall decreased uptake seen at both rest and stress consistent with diaphragmatic attenuation. Rest Images:  Mild inferior wall decreased uptake seen at both rest and stress consistent with diaphragmatic attenuation. Otherwise homogeneous radiotracer uptake. Subtraction (SDS):  No evidence of ischemia. Transient Ischemic Dilatation (Normal <1.22):  1.05 Lung/Heart Ratio (Normal <0.45):  0.27  Quantitative Gated Spect Images QGS EDV:  136 ml QGS ESV:  57 ml  Impression Exercise Capacity:  Poor exercise capacity. BP Response:  Normal blood pressure response. Clinical Symptoms:  Fatigue ECG Impression:  Equivocal EKG changes, 1-1.5 mm ST segment depression in lead V5 and V6. There was one consecutive 4 beat run of wide-complex irregular tachycardia. PVCs noted. Comparison with Prior Nuclear Study:  No images to compare  Overall Impression:  Low risk stress nuclear study with no areas of ischemia identified. EKG abnormalities noted..  LV Ejection Fraction: 58%.  LV Wall Motion:  NL LV Function; NL Wall Motion  Candee Furbish, MD

## 2013-11-05 ENCOUNTER — Telehealth: Payer: Self-pay | Admitting: Nurse Practitioner

## 2013-11-05 NOTE — Telephone Encounter (Signed)
New message     Pt want to talk to Cecille Rubin today before she leaves.  He said he already has a call in to have Dwight call him.  He want to talk about having a heart cath.  He is going to be out out town tomorrow and anxiously awaits her call

## 2013-11-06 ENCOUNTER — Other Ambulatory Visit: Payer: Self-pay

## 2013-11-06 ENCOUNTER — Other Ambulatory Visit: Payer: Self-pay | Admitting: Cardiology

## 2013-11-06 DIAGNOSIS — R079 Chest pain, unspecified: Secondary | ICD-10-CM

## 2013-11-06 DIAGNOSIS — I2510 Atherosclerotic heart disease of native coronary artery without angina pectoris: Secondary | ICD-10-CM

## 2013-11-06 NOTE — Telephone Encounter (Signed)
Returned call to patient's wife cardiac cath scheduled with Dr.Cooper 11/13/13 at 10:00 am arrive at 8:00 am.Patient to come by office 11/10/13 to pick up instructions and have pre cath lab.

## 2013-11-06 NOTE — Telephone Encounter (Signed)
Follow up          Pt is calling about setting up the date for his Cath

## 2013-11-06 NOTE — Telephone Encounter (Signed)
Returned call to patient spoke to wife she stated husband is out of town and wanted her to set up cath for next week.Will call back with cath appointment.

## 2013-11-07 ENCOUNTER — Other Ambulatory Visit: Payer: Self-pay

## 2013-11-07 DIAGNOSIS — I2510 Atherosclerotic heart disease of native coronary artery without angina pectoris: Secondary | ICD-10-CM

## 2013-11-10 ENCOUNTER — Encounter (HOSPITAL_COMMUNITY): Payer: Self-pay

## 2013-11-10 ENCOUNTER — Other Ambulatory Visit: Payer: Medicare Other

## 2013-11-11 ENCOUNTER — Other Ambulatory Visit (INDEPENDENT_AMBULATORY_CARE_PROVIDER_SITE_OTHER): Payer: Medicare Other

## 2013-11-11 ENCOUNTER — Telehealth: Payer: Self-pay | Admitting: Cardiology

## 2013-11-11 DIAGNOSIS — I2510 Atherosclerotic heart disease of native coronary artery without angina pectoris: Secondary | ICD-10-CM

## 2013-11-11 LAB — CBC WITH DIFFERENTIAL/PLATELET
Basophils Absolute: 0 10*3/uL (ref 0.0–0.1)
Basophils Relative: 0.5 % (ref 0.0–3.0)
Eosinophils Absolute: 0.1 10*3/uL (ref 0.0–0.7)
Eosinophils Relative: 1.9 % (ref 0.0–5.0)
HCT: 36.8 % — ABNORMAL LOW (ref 39.0–52.0)
HEMOGLOBIN: 12.6 g/dL — AB (ref 13.0–17.0)
Lymphocytes Relative: 21.3 % (ref 12.0–46.0)
Lymphs Abs: 0.8 10*3/uL (ref 0.7–4.0)
MCHC: 34.2 g/dL (ref 30.0–36.0)
MCV: 98 fl (ref 78.0–100.0)
MONOS PCT: 8.3 % (ref 3.0–12.0)
Monocytes Absolute: 0.3 10*3/uL (ref 0.1–1.0)
NEUTROS ABS: 2.6 10*3/uL (ref 1.4–7.7)
Neutrophils Relative %: 68 % (ref 43.0–77.0)
Platelets: 225 10*3/uL (ref 150.0–400.0)
RBC: 3.76 Mil/uL — ABNORMAL LOW (ref 4.22–5.81)
RDW: 14 % (ref 11.5–15.5)
WBC: 3.9 10*3/uL — AB (ref 4.0–10.5)

## 2013-11-11 LAB — BASIC METABOLIC PANEL
BUN: 12 mg/dL (ref 6–23)
CALCIUM: 9.2 mg/dL (ref 8.4–10.5)
CO2: 27 mEq/L (ref 19–32)
Chloride: 99 mEq/L (ref 96–112)
Creatinine, Ser: 0.9 mg/dL (ref 0.4–1.5)
GFR: 89.7 mL/min (ref >60.00)
GLUCOSE: 124 mg/dL — AB (ref 70–99)
Potassium: 3.6 mEq/L (ref 3.5–5.1)
Sodium: 133 mEq/L — ABNORMAL LOW (ref 135–145)

## 2013-11-11 LAB — PROTIME-INR
INR: 0.9 ratio (ref 0.8–1.0)
Prothrombin Time: 10 s (ref 9.6–13.1)

## 2013-11-11 NOTE — Telephone Encounter (Signed)
Returned call to patient spoke to wife she stated send copy of husband's lab work today to PCP [facility].Also patient wanted follow up cath appt sooner.Stated they wanted to visit their grandchildren.Appointment scheduled with Dr.Turner 11/21/13 at 9:00 am.

## 2013-11-11 NOTE — Telephone Encounter (Signed)
Patient would like [place] to send a copy of his lab work results to Dr. Kathyrn Lass at [facility]

## 2013-11-13 ENCOUNTER — Ambulatory Visit (HOSPITAL_COMMUNITY)
Admission: RE | Admit: 2013-11-13 | Discharge: 2013-11-13 | Disposition: A | Discharge status: Home / Self Care | Payer: Medicare Other | Source: Ambulatory Visit | Attending: Cardiovascular Disease | Admitting: Cardiovascular Disease

## 2013-11-13 ENCOUNTER — Encounter (HOSPITAL_COMMUNITY)
Admission: RE | Disposition: A | Discharge status: Home / Self Care | Payer: Self-pay | Source: Ambulatory Visit | Attending: Cardiovascular Disease

## 2013-11-13 DIAGNOSIS — R079 Chest pain, unspecified: Secondary | ICD-10-CM

## 2013-11-13 DIAGNOSIS — D638 Anemia in other chronic diseases classified elsewhere: Secondary | ICD-10-CM | POA: Insufficient documentation

## 2013-11-13 DIAGNOSIS — I2510 Atherosclerotic heart disease of native coronary artery without angina pectoris: Secondary | ICD-10-CM | POA: Insufficient documentation

## 2013-11-13 DIAGNOSIS — R911 Solitary pulmonary nodule: Secondary | ICD-10-CM | POA: Insufficient documentation

## 2013-11-13 DIAGNOSIS — I6529 Occlusion and stenosis of unspecified carotid artery: Secondary | ICD-10-CM | POA: Insufficient documentation

## 2013-11-13 DIAGNOSIS — E785 Hyperlipidemia, unspecified: Secondary | ICD-10-CM | POA: Diagnosis not present

## 2013-11-13 DIAGNOSIS — R9439 Abnormal result of other cardiovascular function study: Secondary | ICD-10-CM | POA: Diagnosis present

## 2013-11-13 DIAGNOSIS — I440 Atrioventricular block, first degree: Secondary | ICD-10-CM | POA: Insufficient documentation

## 2013-11-13 DIAGNOSIS — I10 Essential (primary) hypertension: Secondary | ICD-10-CM | POA: Insufficient documentation

## 2013-11-13 DIAGNOSIS — R0789 Other chest pain: Secondary | ICD-10-CM | POA: Diagnosis not present

## 2013-11-13 DIAGNOSIS — E041 Nontoxic single thyroid nodule: Secondary | ICD-10-CM | POA: Insufficient documentation

## 2013-11-13 DIAGNOSIS — M1990 Unspecified osteoarthritis, unspecified site: Secondary | ICD-10-CM | POA: Insufficient documentation

## 2013-11-13 DIAGNOSIS — I2584 Coronary atherosclerosis due to calcified coronary lesion: Secondary | ICD-10-CM | POA: Insufficient documentation

## 2013-11-13 DIAGNOSIS — Z87891 Personal history of nicotine dependence: Secondary | ICD-10-CM | POA: Insufficient documentation

## 2013-11-13 DIAGNOSIS — E669 Obesity, unspecified: Secondary | ICD-10-CM | POA: Diagnosis not present

## 2013-11-13 DIAGNOSIS — Z7982 Long term (current) use of aspirin: Secondary | ICD-10-CM | POA: Diagnosis not present

## 2013-11-13 DIAGNOSIS — Z85038 Personal history of other malignant neoplasm of large intestine: Secondary | ICD-10-CM | POA: Diagnosis not present

## 2013-11-13 DIAGNOSIS — N529 Male erectile dysfunction, unspecified: Secondary | ICD-10-CM | POA: Diagnosis not present

## 2013-11-13 HISTORY — PX: LEFT HEART CATHETERIZATION WITH CORONARY ANGIOGRAM: SHX5451

## 2013-11-13 SURGERY — LEFT HEART CATHETERIZATION WITH CORONARY ANGIOGRAM
Anesthesia: LOCAL

## 2013-11-13 MED ORDER — MIDAZOLAM HCL 2 MG/2ML IJ SOLN
INTRAMUSCULAR | Status: AC
  Filled 2013-11-13: qty 2

## 2013-11-13 MED ORDER — SODIUM CHLORIDE 0.9 % IJ SOLN: 3.0000 mL | Freq: Two times a day (BID) | INTRAMUSCULAR | Status: DC

## 2013-11-13 MED ORDER — SODIUM CHLORIDE 0.9 % IV SOLN: 1.0000 mL/kg/h | INTRAVENOUS | Status: DC

## 2013-11-13 MED ORDER — ONDANSETRON HCL 4 MG/2ML IJ SOLN: 4.0000 mg | Freq: Four times a day (QID) | INTRAMUSCULAR | Status: DC | PRN

## 2013-11-13 MED ORDER — ACETAMINOPHEN 325 MG PO TABS: 650.0000 mg | ORAL_TABLET | ORAL | Status: DC | PRN

## 2013-11-13 MED ORDER — ASPIRIN 81 MG PO CHEW: 81.0000 mg | CHEWABLE_TABLET | ORAL | Status: DC

## 2013-11-13 MED ORDER — SODIUM CHLORIDE 0.9 % IJ SOLN: 3.0000 mL | INTRAMUSCULAR | Status: DC | PRN

## 2013-11-13 MED ORDER — HEPARIN (PORCINE) IN NACL 2-0.9 UNIT/ML-% IJ SOLN
INTRAMUSCULAR | Status: AC
  Filled 2013-11-13: qty 1500

## 2013-11-13 MED ORDER — LIDOCAINE HCL (PF) 1 % IJ SOLN
INTRAMUSCULAR | Status: AC
  Filled 2013-11-13: qty 30

## 2013-11-13 MED ORDER — NITROGLYCERIN 0.2 MG/ML ON CALL CATH LAB
INTRAVENOUS | Status: AC
  Filled 2013-11-13: qty 1

## 2013-11-13 MED ORDER — SODIUM CHLORIDE 0.9 % IV SOLN
INTRAVENOUS | Status: DC
  Administered 2013-11-13: 09:00:00 via INTRAVENOUS

## 2013-11-13 MED ORDER — FENTANYL CITRATE 0.05 MG/ML IJ SOLN
INTRAMUSCULAR | Status: AC
  Filled 2013-11-13: qty 2

## 2013-11-13 MED ORDER — SODIUM CHLORIDE 0.9 % IV SOLN: 250.0000 mL | INTRAVENOUS | Status: DC | PRN

## 2013-11-13 NOTE — Discharge Instructions (Signed)

## 2013-11-13 NOTE — H&P (Signed)
History and Physical  Patient ID: Ronnie Robinson MRN: [id number], SOB: 1932-06-07 80 y.o. Date of Encounter: 11/13/2013, 10:37 AM  Primary Physician: Tawanna Solo, MD Primary Cardiologist: Dr Radford Pax  Chief Complaint: Chest pain  HPI: 80 y.o. male w/ PMHx significant for coronary calcification on CT who presented to [facility] on 11/13/2013 for elective heart cath and possible PCI.  He was seen 10/09/2013 for evaluation of chest pain. He describes nonexertional chest discomfort in both the right and left sides of the chest. There is no relation to inspiration, palpation, or association with meals.  He was referred for an exercise stress Myoview scan which demonstrated no significant perfusion defects. However, there was 1.5 mm of ST segment depression and a short run of nonsustained VT. Cardiac catheterization and possible PCI were recommended.  Since the patient's office visit on April 30 which was reviewed in full today, he reports no interval change in symptoms.    Past Medical History  Diagnosis Date  . Hypertension   . Hyperlipidemia     coronary artery calcification on Chest CT's goal LDL <70  . Neuromuscular disorder   . Colon cancer dx'd 12/2009    a. 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 CT scan 10/2012 no evidence of disease.  . Arthritis   . Anemia     unknown etiology since 2007 Dr. Deatra Ina Nl CBC 6/11, ? Anemia of chronic disease  . Osteoarthritis   . Thyroid nodule      cystic and solid nodules 12/11, f/u in 6-12 mos;9/12 Stable B nodules, left mid lobe with nodule with calc 58mm nodule, consider bx, per D.r Buddy Duty, recheck u/s in 6-12 mos 3/13-9/13; 5/13 Stable nodules, thyroid slightly smaller  . Paresthesia   . Edema   . Lumbar degenerative disc disease   . 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 Deatra Ina, Dr.Gross, margins neg, nodes neg 13/13, Dr. Jamse Arn  . Carotid artery  stenosis     a. 50-69% BICA by duplex 04/2013 - additionally, mild RECA stenosis, mild bilateral subclavian artery stenosis.  Marland Kitchen CAD (coronary artery disease)     a. Coronary artery calcifications on chest CT 2013 incidentally noted. b. nuclear stress test 2013 with no ischemia  . Erectile dysfunction   . Obesity (BMI 30-39.9)   . Pulmonary nodule   . Sigmoid diverticulosis   . 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     Surgical History:  Past Surgical History  Procedure Laterality Date  . Tonsilectomy, adenoidectomy, bilateral myringotomy and tubes    . Colonoscopy    . Polypectomy    . Colon surgery    . Upper gastrointestinal endoscopy    . Colon resection  2011  . Posterior fusion lumbar spine  07/22/2012    Dr  Luiz Ochoa     Home Meds: Prior to Admission medications   Medication Sig Start Date End Date Taking? Authorizing Provider  amLODipine (NORVASC) 5 MG tablet Take 1 tablet (5 mg total) by mouth daily. 04/02/13  Yes Sueanne Margarita, MD  Ascorbic Acid (VITAMIN C WITH ROSE HIPS) 500 MG tablet Take 500 mg by mouth daily.    Yes Historical Provider, MD  aspirin 325 MG tablet Take 325 mg by mouth daily.     Yes Historical Provider, MD  atorvastatin (LIPITOR) 40 MG tablet Take 40  mg by mouth daily.    Yes Historical Provider, MD  doxazosin (CARDURA) 2 MG tablet Take 1 tablet (2 mg total) by mouth at bedtime. 04/02/13  Yes Sueanne Margarita, MD  fish oil-omega-3 fatty acids 1000 MG capsule Take 3 g by mouth 2 (two) times daily.    Yes Historical Provider, MD  hydrALAZINE (APRESOLINE) 100 MG tablet Take 100 mg by mouth 2 (two) times daily.    Yes Historical Provider, MD  hydrochlorothiazide (HYDRODIURIL) 25 MG tablet Take 1 tablet (25 mg total) by mouth daily. 04/02/13  Yes Sueanne Margarita, MD  ketoconazole (NIZORAL) 2 % cream Apply 1 application topically daily as needed for irritation.   Yes Historical Provider, MD  Misc Natural  Products (OSTEO BI-FLEX ADV JOINT SHIELD PO) Take 1 tablet by mouth 2 (two) times daily.    Yes Historical Provider, MD  Multiple Vitamin (MULTIVITAMIN WITH MINERALS) TABS Take 1 tablet by mouth daily.   Yes Historical Provider, MD  Multiple Vitamins-Minerals (OCUVITE-LUTEIN PO) Take 1 tablet by mouth daily.    Yes Historical Provider, MD  pimecrolimus (ELIDEL) 1 % cream Apply 1 application topically as needed. For rosacea   Yes Historical Provider, MD  potassium chloride (K-DUR,KLOR-CON) 10 MEQ tablet Take 2 tablets (20 mEq total) by mouth daily. 04/02/13  Yes Sueanne Margarita, MD  psyllium (METAMUCIL) 58.6 % powder Take 1 packet by mouth daily.    Yes Historical Provider, MD  vitamin B-12 (CYANOCOBALAMIN) 100 MCG tablet Take 100 mcg by mouth daily.   Yes Historical Provider, MD    Allergies:  Allergies  Allergen Reactions  . Micardis [Telmisartan] Anaphylaxis    Swelling to head, face, and throat.  . Metoprolol     Type I second degree AV block with beta blockers    History   Social History  . Marital Status: Married    Spouse Name: N/A    Number of Children: N/A  . Years of Education: N/A   Occupational History  . Not on file.   Social History Main Topics  . Smoking status: Former Smoker -- 1.00 packs/day for 5 years    Types: Cigarettes    Quit date: 06/12/1958  . Smokeless tobacco: Never Used  . Alcohol Use: 3.5 oz/week    7 drink(s) per week     Comment: social  . Drug Use: No  . Sexual Activity: Not on file   Other Topics Concern  . Not on file   Social History Narrative  . No narrative on file     Family History  Problem Relation Age of Onset  . Cancer - Prostate Father   . Mastocytosis      Review of Systems: General: negative for chills, fever, night sweats or weight changes.  ENT: negative for rhinorrhea or epistaxis Cardiovascular: See history of present illness Dermatological: negative for rash Respiratory: negative for cough or wheezing GI:  negative for nausea, vomiting, diarrhea, bright red blood per rectum, melena, or hematemesis GU: no hematuria, urgency, or frequency Neurologic: negative for visual changes, syncope, headache, or dizziness Heme: no easy bruising or bleeding Endo: negative for excessive thirst, thyroid disorder, or flushing Musculoskeletal: Positive for diffuse joint pains related to osteoarthritis All other systems reviewed and are otherwise negative except as noted above.  Physical Exam: Blood pressure 120/52, pulse 66, temperature 97.6 F (36.4 C), temperature source Oral, resp. rate 18, height 5\' 11"  (1.803 m), weight 86.183 kg (190 lb), SpO2 100.00%. General: Well developed, well nourished,  alert and oriented, pleasant elderly male in no acute distress. HEENT: Normocephalic, atraumatic, sclera non-icteric, no xanthomas, nares are without discharge.  Neck: Supple. Carotids 2+ without bruits. JVP normal Lungs: Clear bilaterally to auscultation without wheezes, rales, or rhonchi. Breathing is unlabored. Heart: RRR with normal S1 and S2. No murmurs, rubs, or gallops appreciated. Abdomen: Soft, non-tender, non-distended with normoactive bowel sounds. No hepatomegaly. No rebound/guarding. No obvious abdominal masses. Back: No CVA tenderness Msk:  Strength and tone appear normal for age. Extremities: No clubbing, cyanosis, or edema.  Distal pedal pulses are 2+ and equal bilaterally. Neuro: CNII-XII intact, moves all extremities spontaneously. Psych:  Responds to questions appropriately with a normal affect.   Labs:   Lab Results  Component Value Date   WBC 3.9* 11/11/2013   HGB 12.6* 11/11/2013   HCT 36.8* 11/11/2013   MCV 98.0 11/11/2013   PLT 225.0 11/11/2013    Recent Labs Lab 11/11/13 1045  NA 133*  K 3.6  CL 99  CO2 27  BUN 12  CREATININE 0.9  CALCIUM 9.2  GLUCOSE 124*   No results found for this basename: CKTOTAL, CKMB, TROPONINI,  in the last 72 hours Lab Results  Component Value Date   CHOL  168 09/15/2013   HDL 92.20 09/15/2013   LDLCALC 69 09/15/2013   TRIG 33.0 09/15/2013   No results found for this basename: DDIMER    Radiology/Studies:  No results found.   Stress Myoview scan: QPS  Raw Data Images: Mild diaphragmatic attenuation; normal left ventricular size.  Stress Images: There is mild inferior wall decreased uptake seen at both rest and stress consistent with diaphragmatic attenuation.  Rest Images: Mild inferior wall decreased uptake seen at both rest and stress consistent with diaphragmatic attenuation. Otherwise homogeneous radiotracer uptake.  Subtraction (SDS): No evidence of ischemia.  Transient Ischemic Dilatation (Normal <1.22): 1.05  Lung/Heart Ratio (Normal <0.45): 0.27  Quantitative Gated Spect Images  QGS EDV: 136 ml  QGS ESV: 57 ml  Impression  Exercise Capacity: Poor exercise capacity.  BP Response: Normal blood pressure response.  Clinical Symptoms: Fatigue  ECG Impression: Equivocal EKG changes, 1-1.5 mm ST segment depression in lead V5 and V6. There was one consecutive 4 beat run of wide-complex irregular tachycardia. PVCs noted.  Comparison with Prior Nuclear Study: No images to compare  Overall Impression: Low risk stress nuclear study with no areas of ischemia identified. EKG abnormalities noted..  LV Ejection Fraction: 58%. LV Wall Motion: NL LV Function; NL Wall Motion  Candee Furbish, MD   ASSESSMENT AND PLAN:   80 year old gentleman presenting for cardiac catheterization and possible PCI in the setting of chest pain, known coronary calcification, and an abnormal exercise stress test. I have reviewed the risks, indications, and alternatives to cardiac catheterization and possible PCI. The patient understands and agrees to proceed.   Signed, [name] MD  11/13/2013, 10:37 AM

## 2013-11-13 NOTE — Interval H&P Note (Signed)
History and Physical Interval Note:  11/13/2013 10:43 AM  Ronnie Robinson  has presented today for surgery, with the diagnosis of cp  The various methods of treatment have been discussed with the patient and family. After consideration of risks, benefits and other options for treatment, the patient has consented to  Procedure(s): LEFT HEART CATHETERIZATION WITH CORONARY ANGIOGRAM (N/A) as a surgical intervention .  The patient's history has been reviewed, patient examined, no change in status, stable for surgery.  I have reviewed the patient's chart and labs.  Questions were answered to the patient's satisfaction.    Cath Lab Visit (complete for each Cath Lab visit)  Clinical Evaluation Leading to the Procedure:   ACS: no  Non-ACS:    Anginal Classification: CCS II  Anti-ischemic medical therapy: Minimal Therapy (1 class of medications)  Non-Invasive Test Results: Low-risk stress test findings: cardiac mortality <1%/year  Prior CABG: No previous CABG       [name]

## 2013-11-13 NOTE — CV Procedure (Signed)
    Cardiac Catheterization Procedure Note  Name: Ronnie Robinson MRN: [id number] DOB: 06-01-1932  Procedure: Left Heart Cath, Selective Coronary Angiography, LV angiography  Indication: Abnormal stress test, coronary calcification on CT   Procedural Details: The right wrist was prepped, draped, and anesthetized with 1% lidocaine. Using the modified Seldinger technique, a 5/6 French sheath was introduced into the right radial artery. 3 mg of verapamil was administered through the sheath, weight-based unfractionated heparin was administered intravenously. Standard Judkins catheters were used for selective coronary angiography and left ventriculography. Catheter exchanges were performed over an exchange length guidewire. There were no immediate procedural complications. A TR band was used for radial hemostasis at the completion of the procedure.  The patient was transferred to the post catheterization recovery area for further monitoring.  Procedural Findings: Hemodynamics: AO 108/50 LV 109/7  Coronary angiography: Coronary dominance: right  Left mainstem: There is mild calcification present. The left main has no obstructive disease. Divides into the LAD and left circumflex there  Left anterior descending (LAD): The LAD courses to the LV apex. The vessel has moderate proximal calcification. The first diagonal has 50% stenosis. The second diagonal has minor nonobstructive disease. The LAD proper has minimal irregularity but no significant stenosis.  Left circumflex (LCx): The left circumflex as 30-40% ostial stenosis. The OM branches are patent.  Right coronary artery (RCA): The RCA is dominant. There is mild calcification present. The vessel has minor irregularities but no significant stenoses are identified. The PDA and PLA branches are patent.  Left ventriculography: Left ventricular systolic function is normal, LVEF is estimated at 55-65%, there is no significant mitral regurgitation    Final Conclusions:   1. Mild nonobstructive CAD without evidence of high grade coronary stenoses. 2. Normal LV systolic function and normal LVEDP  Recommendations: Suspect noncardiac chest pain. Medical therapy is indicated for nonobstructive CAD.  [name] 11/13/2013, 11:35 AM

## 2013-11-21 ENCOUNTER — Ambulatory Visit (INDEPENDENT_AMBULATORY_CARE_PROVIDER_SITE_OTHER): Payer: Medicare Other | Admitting: Cardiology

## 2013-11-21 ENCOUNTER — Encounter: Payer: Self-pay | Admitting: Cardiology

## 2013-11-21 VITALS — BP 122/58 | HR 70 | Ht 71.0 in | Wt 185.0 lb

## 2013-11-21 DIAGNOSIS — I10 Essential (primary) hypertension: Secondary | ICD-10-CM

## 2013-11-21 DIAGNOSIS — I739 Peripheral vascular disease, unspecified: Secondary | ICD-10-CM

## 2013-11-21 DIAGNOSIS — E785 Hyperlipidemia, unspecified: Secondary | ICD-10-CM

## 2013-11-21 DIAGNOSIS — I2510 Atherosclerotic heart disease of native coronary artery without angina pectoris: Secondary | ICD-10-CM

## 2013-11-21 DIAGNOSIS — I779 Disorder of arteries and arterioles, unspecified: Secondary | ICD-10-CM

## 2013-11-21 NOTE — Progress Notes (Signed)
[address] [place], [place]  [address] Phone: [phone] Fax:  [phone]  Date:  11/21/2013   ID:  Ronnie Robinson, DOB 10/25/1931, MRN [id number]  PCP:  Tawanna Solo, MD  Cardiologist:  Fransico Him, MD     History of Present Illness: Ronnie Robinson is a 80 y.o. male with a history of coronary artery calcifications and no angina, HTN, dyslipidemia and carotid artery stenosis who recently presented with chest pain and underwent nuclear stress test that showed normal myocardial perfusion but had a positive EKG portion and underwent cath which showed mild nonobstructive ASCAD with a 30-40% ostial left circ, 50% first diagonal and normal LVF and was felt his CP was noncardiac.  He now presents back for followup.  He denies any SOB, DOE, dizziness, palpitations or syncope. He has chronic back pain despite having back pain last winter. He has chronic LE edema which is stable on diuretics.  He still occasionally has a mild ache over the sternum once and a while. The achiness is not brought on by exertion.   Wt Readings from Last 3 Encounters:  11/21/13 185 lb (83.915 kg)  11/13/13 190 lb (86.183 kg)  11/13/13 190 lb (86.183 kg)     Past Medical History  Diagnosis Date  . Hypertension   . Hyperlipidemia     coronary artery calcification on Chest CT's goal LDL <70  . Neuromuscular disorder   . Colon cancer dx'd 12/2009    a. 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 CT scan 10/2012 no evidence of disease.  . Arthritis   . Anemia     unknown etiology since 2007 Dr. Deatra Ina Nl CBC 6/11, ? Anemia of chronic disease  . Osteoarthritis   . Thyroid nodule      cystic and solid nodules 12/11, f/u in 6-12 mos;9/12 Stable B nodules, left mid lobe with nodule with calc 34mm nodule, consider bx, per D.r Buddy Duty, recheck u/s in 6-12 mos 3/13-9/13; 5/13 Stable nodules, thyroid slightly smaller  . Paresthesia   . Edema   . Lumbar degenerative disc  disease   . 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 Deatra Ina, Dr.Gross, margins neg, nodes neg 13/13, Dr. Jamse Arn  . Carotid artery stenosis     a. 50-69% BICA by duplex 04/2013 - additionally, mild RECA stenosis, mild bilateral subclavian artery stenosis.  . Erectile dysfunction   . Obesity (BMI 30-39.9)   . Pulmonary nodule   . Sigmoid diverticulosis   . 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  Marland Kitchen CAD (coronary artery disease)     a. Coronary artery calcifications on chest CT 2013 incidentally noted. b. nuclear stress test 2013 with no ischemia.  Cath 2015 showed nonobstructive ASCAD with 50% diagonal and 30% left circ    Current Outpatient Prescriptions  Medication Sig Dispense Refill  . amLODipine (NORVASC) 5 MG tablet Take 1 tablet (5 mg total) by mouth daily.  90 tablet  3  . Ascorbic Acid (VITAMIN C WITH ROSE HIPS) 500 MG tablet Take 500 mg by mouth daily.       Marland Kitchen aspirin 325 MG tablet Take 325 mg by mouth daily.        Marland Kitchen atorvastatin (LIPITOR) 40 MG tablet Take 40 mg by mouth daily.       Marland Kitchen doxazosin (CARDURA) 2 MG tablet Take  1 tablet (2 mg total) by mouth at bedtime.  90 tablet  3  . fish oil-omega-3 fatty acids 1000 MG capsule Take 3 g by mouth 2 (two) times daily.       . hydrALAZINE (APRESOLINE) 100 MG tablet Take 100 mg by mouth 2 (two) times daily.       . hydrochlorothiazide (HYDRODIURIL) 25 MG tablet Take 1 tablet (25 mg total) by mouth daily.  90 tablet  3  . ketoconazole (NIZORAL) 2 % cream Apply 1 application topically daily as needed for irritation.      . Misc Natural Products (OSTEO BI-FLEX ADV JOINT SHIELD PO) Take 1 tablet by mouth 2 (two) times daily.       . Multiple Vitamin (MULTIVITAMIN WITH MINERALS) TABS Take 1 tablet by mouth daily.      . Multiple Vitamins-Minerals (OCUVITE-LUTEIN PO) Take 1 tablet by mouth daily.       . pimecrolimus (ELIDEL) 1 % cream Apply 1  application topically as needed. For rosacea      . potassium chloride (K-DUR,KLOR-CON) 10 MEQ tablet Take 2 tablets (20 mEq total) by mouth daily.  180 tablet  3  . psyllium (METAMUCIL) 58.6 % powder Take 1 packet by mouth daily.       . vitamin B-12 (CYANOCOBALAMIN) 100 MCG tablet Take 100 mcg by mouth daily.       No current facility-administered medications for this visit.    Allergies:    Allergies  Allergen Reactions  . Micardis [Telmisartan] Anaphylaxis    Swelling to head, face, and throat.  . Metoprolol     Type I second degree AV block with beta blockers    Social History:  The patient  reports that he quit smoking about 55 years ago. His smoking use included Cigarettes. He has a 5 pack-year smoking history. He has never used smokeless tobacco. He reports that he drinks about 3.5 ounces of alcohol per week. He reports that he does not use illicit drugs.   Family History:  The patient's family history includes Cancer - Prostate in his father; Mastocytosis in an other family member.   ROS:  Please see the history of present illness.      All other systems reviewed and negative.   PHYSICAL EXAM: VS:  BP 122/58  Pulse 70  Ht 5\' 11"  (1.803 m)  Wt 185 lb (83.915 kg)  BMI 25.81 kg/m2 Well nourished, well developed, in no acute distress HEENT: normal Neck: no JVD, right carotid bruits Cardiac:  normal S1, S2; RRR; no murmur Lungs:  clear to auscultation bilaterally, no wheezing, rhonchi or rales Abd: soft, nontender, no hepatomegaly Ext: no edema Skin: warm and dry Neuro:  CNs 2-12 intact, no focal abnormalities noted  ASSESSMENT AND PLAN:        1.  Nonobstructive ASCAD by recent cath. He says that he only intermittently has some chest pain over the sternum that is not really bothersome.  I recommended starting Imdur 30mg  daily but he wants to wait because he says it dones not really bother him.  - continue ASA  2. HTN - controlled  - continue  Amlodipine/doxazosin/hydralazine/HCTZ  3. Dyslipidemia - at goal - continue fish oil/lipitor  4. Moderate Carotid artery stenosis on ASA - repeat dopplers scheduled in 6 months   Followup with me in 1 year   Signed, Fransico Him, MD 11/21/2013 9:25 AM

## 2013-11-21 NOTE — Patient Instructions (Signed)
Your physician recommends that you continue on your current medications as directed. Please refer to the Current Medication list given to you today.  Your physician wants you to follow-up in: 1 year. You will receive a reminder letter in the mail two months in advance. If you don't receive a letter, please call our office to schedule the follow-up appointment.  

## 2013-12-08 ENCOUNTER — Encounter: Payer: Medicare Other | Admitting: Cardiology

## 2014-01-27 ENCOUNTER — Encounter: Payer: Self-pay | Admitting: Gastroenterology

## 2014-01-27 ENCOUNTER — Other Ambulatory Visit: Payer: Self-pay

## 2014-01-27 MED ORDER — POTASSIUM CHLORIDE CRYS ER 10 MEQ PO TBCR: 20.0000 meq | EXTENDED_RELEASE_TABLET | Freq: Every day | ORAL | Status: DC

## 2014-04-01 ENCOUNTER — Encounter: Payer: Self-pay | Admitting: Gastroenterology

## 2014-04-01 ENCOUNTER — Ambulatory Visit (INDEPENDENT_AMBULATORY_CARE_PROVIDER_SITE_OTHER): Payer: Medicare Other | Admitting: Gastroenterology

## 2014-04-01 VITALS — BP 140/60 | HR 72 | Ht 70.0 in | Wt 191.0 lb

## 2014-04-01 DIAGNOSIS — I2510 Atherosclerotic heart disease of native coronary artery without angina pectoris: Secondary | ICD-10-CM

## 2014-04-01 DIAGNOSIS — Z85038 Personal history of other malignant neoplasm of large intestine: Secondary | ICD-10-CM | POA: Insufficient documentation

## 2014-04-01 DIAGNOSIS — I2583 Coronary atherosclerosis due to lipid rich plaque: Principal | ICD-10-CM

## 2014-04-01 MED ORDER — NA SULFATE-K SULFATE-MG SULF 17.5-3.13-1.6 GM/177ML PO SOLN: 1.0000 | Freq: Once | ORAL | Status: DC

## 2014-04-01 NOTE — Progress Notes (Signed)
_                                                                                                                History of Present Illness:  Ronnie Robinson is a pleasant 80 year old white male with history of colon cancer diagnosed 2011 here for scheduled followup.  He underwent a right hemicolectomy only.  Followup colonoscopy one year later was normal except for diverticulosis.  He has no GI complaints including abdominal pain, change in bowel habits or rectal bleeding.   Past Medical History  Diagnosis Date  . Hypertension   . Hyperlipidemia     coronary artery calcification on Chest CT's goal LDL <70  . Neuromuscular disorder   . Colon cancer dx'd 12/2009    a. 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 CT scan 10/2012 no evidence of disease.  . Arthritis   . Anemia     unknown etiology since 2007 Ronnie Robinson Nl CBC 6/11, ? Anemia of chronic disease  . Osteoarthritis   . Thyroid nodule      cystic and solid nodules 12/11, f/u in 6-12 mos;9/12 Stable B nodules, left mid lobe with nodule with calc 35mm nodule, consider bx, per D.r Ronnie Robinson, recheck u/s in 6-12 mos 3/13-9/13; 5/13 Stable nodules, thyroid slightly smaller  . Paresthesia   . Edema   . Lumbar degenerative disc disease   . Colon polyp     adenomatous 2007, nl 2008, polyps adenocarcinoma 6/11, Ronnie Robinson, Ronnie Robinson, margins neg, nodes neg 13/13, Ronnie Robinson  . Carotid artery stenosis     a. 50-69% BICA by duplex 04/2013 - additionally, mild RECA stenosis, mild bilateral subclavian artery stenosis.  . Erectile dysfunction   . Obesity (BMI 30-39.9)   . Pulmonary nodule   . Sigmoid diverticulosis   . 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  Marland Kitchen CAD (coronary artery disease)     a. Coronary artery calcifications on chest CT 2013 incidentally noted. b. nuclear stress test 2013 with no ischemia.  Cath 2015 showed nonobstructive  ASCAD with 50% diagonal and 30% left circ   Past Surgical History  Procedure Laterality Date  . Tonsillectomy and adenoidectomy    . Colonoscopy    . Polypectomy    . Upper gastrointestinal endoscopy    . Colon resection  2011  . Posterior fusion lumbar spine  07/22/2012    Dr  Ronnie Robinson  . Cataract extraction Bilateral   . Mohs surgery      face   family history includes Cancer - Prostate in his father; Mastocytosis in his brother; Skin cancer in his father. Current Outpatient Prescriptions  Medication Sig Dispense Refill  . amLODipine (NORVASC) 5 MG tablet Take 1 tablet (5 mg total) by mouth daily.  90 tablet  3  . Ascorbic Acid (VITAMIN C WITH ROSE HIPS) 500 MG tablet Take 500 mg by mouth daily.       Marland Kitchen  aspirin 325 MG tablet Take 325 mg by mouth daily.        Marland Kitchen atorvastatin (LIPITOR) 40 MG tablet Take 40 mg by mouth daily.       Marland Kitchen doxazosin (CARDURA) 2 MG tablet Take 1 tablet (2 mg total) by mouth at bedtime.  90 tablet  3  . fish oil-omega-3 fatty acids 1000 MG capsule Take 3 g by mouth 2 (two) times daily.       . hydrALAZINE (APRESOLINE) 100 MG tablet Take 100 mg by mouth 2 (two) times daily.       . hydrochlorothiazide (HYDRODIURIL) 25 MG tablet Take 1 tablet (25 mg total) by mouth daily.  90 tablet  3  . ketoconazole (NIZORAL) 2 % cream Apply 1 application topically daily as needed for irritation.      . Misc Natural Products (OSTEO BI-FLEX ADV JOINT SHIELD PO) Take 1 tablet by mouth 2 (two) times daily.       . Multiple Vitamin (MULTIVITAMIN WITH MINERALS) TABS Take 1 tablet by mouth daily.      . Multiple Vitamins-Minerals (OCUVITE-LUTEIN PO) Take 1 tablet by mouth daily.       . pimecrolimus (ELIDEL) 1 % cream Apply 1 application topically as needed. For rosacea      . potassium chloride (K-DUR,KLOR-CON) 10 MEQ tablet Take 2 tablets (20 mEq total) by mouth daily.  180 tablet  3  . psyllium (METAMUCIL) 58.6 % powder Take 1 packet by mouth daily.       . vitamin B-12  (CYANOCOBALAMIN) 100 MCG tablet Take 100 mcg by mouth daily.       No current facility-administered medications for this visit.   Allergies as of 04/01/2014 - Review Complete 04/01/2014  Allergen Reaction Noted  . Micardis [telmisartan] Anaphylaxis 10/25/2011  . Metoprolol  04/01/2013    reports that he quit smoking about 55 years ago. His smoking use included Cigarettes. He has a 5 pack-year smoking history. He has never used smokeless tobacco. He reports that he drinks about 3.5 ounces of alcohol per week. He reports that he does not use illicit drugs.   Review of Systems: He complains of joint pains Pertinent positive and negative review of systems were noted in the above HPI section. All other review of systems were otherwise negative.  Vital signs were reviewed in today's medical record Physical Exam: General: Well developed , well nourished, no acute distress Skin: anicteric Head: Normocephalic and atraumatic Eyes:  sclerae anicteric, EOMI Ears: Normal auditory acuity Mouth: No deformity or lesions Neck: Supple, no masses or thyromegaly Lungs: Clear throughout to auscultation Heart: Regular rate and rhythm; no murmurs, rubs or bruits Abdomen: Soft, non tender and non distended. No masses, hepatosplenomegaly or hernias noted. Normal Bowel sounds Rectal:deferred Musculoskeletal: Symmetrical with no gross deformities  Skin: No lesions on visible extremities Pulses:  Normal pulses noted Extremities: No clubbing, cyanosis, edema or deformities noted Neurological: Alert oriented x 4, grossly nonfocal Cervical Nodes:  No significant cervical adenopathy Inguinal Nodes: No significant inguinal adenopathy Psychological:  Alert and cooperative. Normal mood and affect  See Assessment and Plan under Problem List

## 2014-04-01 NOTE — Patient Instructions (Signed)

## 2014-04-01 NOTE — Assessment & Plan Note (Signed)
Plan followup colonoscopy 

## 2014-04-06 ENCOUNTER — Other Ambulatory Visit: Payer: Medicare Other

## 2014-04-06 ENCOUNTER — Encounter: Payer: Self-pay | Admitting: Cardiology

## 2014-04-07 ENCOUNTER — Encounter: Payer: Self-pay | Admitting: Gastroenterology

## 2014-04-09 ENCOUNTER — Telehealth: Payer: Self-pay | Admitting: Cardiology

## 2014-04-09 NOTE — Telephone Encounter (Signed)
New message ° °Pt returned call  °

## 2014-04-09 NOTE — Telephone Encounter (Signed)
Gave patient recent lab results.

## 2014-04-20 ENCOUNTER — Other Ambulatory Visit: Payer: Self-pay | Admitting: *Deleted

## 2014-04-20 MED ORDER — AMLODIPINE BESYLATE 5 MG PO TABS: 5.0000 mg | ORAL_TABLET | Freq: Every day | ORAL | Status: DC

## 2014-04-22 ENCOUNTER — Other Ambulatory Visit: Payer: Self-pay

## 2014-04-22 MED ORDER — AMLODIPINE BESYLATE 5 MG PO TABS: 5.0000 mg | ORAL_TABLET | Freq: Every day | ORAL | Status: DC

## 2014-04-27 ENCOUNTER — Ambulatory Visit (HOSPITAL_COMMUNITY): Payer: Medicare Other | Attending: Cardiovascular Disease | Admitting: Cardiology

## 2014-04-27 DIAGNOSIS — E785 Hyperlipidemia, unspecified: Secondary | ICD-10-CM | POA: Diagnosis not present

## 2014-04-27 DIAGNOSIS — I779 Disorder of arteries and arterioles, unspecified: Secondary | ICD-10-CM

## 2014-04-27 DIAGNOSIS — I10 Essential (primary) hypertension: Secondary | ICD-10-CM | POA: Insufficient documentation

## 2014-04-27 DIAGNOSIS — I6523 Occlusion and stenosis of bilateral carotid arteries: Secondary | ICD-10-CM

## 2014-04-27 DIAGNOSIS — I739 Peripheral vascular disease, unspecified: Secondary | ICD-10-CM

## 2014-04-27 DIAGNOSIS — I2510 Atherosclerotic heart disease of native coronary artery without angina pectoris: Secondary | ICD-10-CM | POA: Diagnosis present

## 2014-04-27 DIAGNOSIS — Z87891 Personal history of nicotine dependence: Secondary | ICD-10-CM | POA: Insufficient documentation

## 2014-04-27 NOTE — Progress Notes (Signed)
Carotid duplex performed 

## 2014-05-04 ENCOUNTER — Telehealth: Payer: Self-pay | Admitting: Cardiology

## 2014-05-04 ENCOUNTER — Other Ambulatory Visit: Payer: Self-pay

## 2014-05-04 DIAGNOSIS — I6523 Occlusion and stenosis of bilateral carotid arteries: Secondary | ICD-10-CM

## 2014-05-04 NOTE — Telephone Encounter (Signed)
Informed patient of carotid duplex results. Informed patient results are being sent to PCP for F/U on thyroid. Patient agreeable.

## 2014-05-04 NOTE — Telephone Encounter (Signed)
New message ° ° ° ° ° ° °Pt returning nurse call  °

## 2014-05-12 ENCOUNTER — Encounter: Payer: Self-pay | Admitting: Gastroenterology

## 2014-05-12 ENCOUNTER — Ambulatory Visit (AMBULATORY_SURGERY_CENTER): Payer: Medicare Other | Admitting: Gastroenterology

## 2014-05-12 VITALS — BP 133/71 | HR 53 | Temp 97.9°F | Resp 18 | Ht 70.0 in | Wt 193.0 lb

## 2014-05-12 DIAGNOSIS — K5730 Diverticulosis of large intestine without perforation or abscess without bleeding: Secondary | ICD-10-CM

## 2014-05-12 DIAGNOSIS — Z85038 Personal history of other malignant neoplasm of large intestine: Secondary | ICD-10-CM

## 2014-05-12 MED ORDER — SODIUM CHLORIDE 0.9 % IV SOLN: 500.0000 mL | INTRAVENOUS | Status: DC

## 2014-05-12 NOTE — Patient Instructions (Signed)
YOU HAD AN ENDOSCOPIC PROCEDURE TODAY AT [facility]: Refer to the procedure report that was given to you for any specific questions about what was found during the examination.  If the procedure report does not answer your questions, please call your gastroenterologist to clarify.  If you requested that your care partner not be given the details of your procedure findings, then the procedure report has been included in a sealed envelope for you to review at your convenience later.  YOU SHOULD EXPECT: Some feelings of bloating in the abdomen. Passage of more gas than usual.  Walking can help get rid of the air that was put into your GI tract during the procedure and reduce the bloating. If you had a lower endoscopy (such as a colonoscopy or flexible sigmoidoscopy) you may notice spotting of blood in your stool or on the toilet paper. If you underwent a bowel prep for your procedure, then you may not have a normal bowel movement for a few days.  DIET: Your first meal following the procedure should be a light meal and then it is ok to progress to your normal diet.  A half-sandwich or bowl of soup is an example of a good first meal.  Heavy or fried foods are harder to digest and may make you feel nauseous or bloated.  Likewise meals heavy in dairy and vegetables can cause extra gas to form and this can also increase the bloating.  Drink plenty of fluids but you should avoid alcoholic beverages for 24 hours.  ACTIVITY: Your care partner should take you home directly after the procedure.  You should plan to take it easy, moving slowly for the rest of the day.  You can resume normal activity the day after the procedure however you should NOT DRIVE or use heavy machinery for 24 hours (because of the sedation medicines used during the test).    SYMPTOMS TO REPORT IMMEDIATELY: A gastroenterologist can be reached at any hour.  During normal business hours, 8:30 AM to 5:00 PM Monday through Friday,  call [phone].  After hours and on weekends, please call the GI answering service at [phone] who will take a message and have the physician on call contact you.   Following lower endoscopy (colonoscopy or flexible sigmoidoscopy):  Excessive amounts of blood in the stool  Significant tenderness or worsening of abdominal pains  Swelling of the abdomen that is new, acute  Fever of 100F or higher   FOLLOW UP: If any biopsies were taken you will be contacted by phone or by letter within the next 1-3 weeks.  Call your gastroenterologist if you have not heard about the biopsies in 3 weeks.  Our staff will call the home number listed on your records the next business day following your procedure to check on you and address any questions or concerns that you may have at that time regarding the information given to you following your procedure. This is a courtesy call and so if there is no answer at the home number and we have not heard from you through the emergency physician on call, we will assume that you have returned to your regular daily activities without incident.  SIGNATURES/CONFIDENTIALITY: You and/or your care partner have signed paperwork which will be entered into your electronic medical record.  These signatures attest to the fact that that the information above on your After Visit Summary has been reviewed and is understood.  Full responsibility of the confidentiality of  this discharge information lies with you and/or your care-partner.  Recommendations Given your age, you will not need another routine colonoscopy. Diverticulosis and high fiber diet handouts provided to patient/care partner.

## 2014-05-12 NOTE — Op Note (Signed)
[place]  [address] [place], [id number]   COLONOSCOPY PROCEDURE REPORT  PATIENT: Ronnie Robinson, Ronnie Robinson  MR#: [id number] BIRTHDATE: [birth year] , 26  yrs. old GENDER: male ENDOSCOPIST: Inda Castle, MD REFERRED XH:FSFS Sabra Heck, M.D. PROCEDURE DATE:  05/12/2014 PROCEDURE:   Colonoscopy, diagnostic First Screening Colonoscopy - Avg.  risk and is 50 yrs.  old or older - No.  Prior Negative Screening - Now for repeat screening. N/A  History of Adenoma - Now for follow-up colonoscopy & has been > or = to 3 yrs.  N/A  Polyps Removed Today? No.  Recommend repeat exam, <10 yrs? No. ASA CLASS:   Class II INDICATIONS:high risk personal history of colon cancer. 2011 MEDICATIONS: Monitored anesthesia care and Propofol 200 mg IV  DESCRIPTION OF PROCEDURE:   After the risks benefits and alternatives of the procedure were thoroughly explained, informed consent was obtained.  The digital rectal exam revealed no abnormalities of the rectum.   The LB [id number]  endoscope was introduced through the anus and advanced to the surgical anastomosis. No adverse events experienced.   The quality of the prep was excellent using Suprep  The instrument was then slowly withdrawn as the colon was fully examined.      COLON FINDINGS: There was moderate diverticulosis noted in the descending colon and sigmoid colon.   The examination was otherwise normal.  Retroflexed views revealed no abnormalities. The time to anastomosis=5 minutes 32 seconds.  Withdrawal time=6 minutes 01 seconds.  The scope was withdrawn and the procedure completed. COMPLICATIONS: There were no immediate complications.  ENDOSCOPIC IMPRESSION: 1.   Moderate diverticulosis was noted in the descending colon and sigmoid colon 2.   The examination was otherwise normal  RECOMMENDATIONS: Given your age, you will not need another colonoscopy for colon cancer screening or polyp surveillance.  These types of  tests usually stop around the age 74.  eSigned:  Inda Castle, MD 05/12/2014 4:10 PM   cc:

## 2014-05-12 NOTE — Progress Notes (Signed)
Pt awake, alert and oriented x3. Vss. Pleased with MAC. Report to RN

## 2014-05-13 ENCOUNTER — Telehealth: Payer: Self-pay | Admitting: *Deleted

## 2014-05-13 NOTE — Telephone Encounter (Signed)
  Follow up Call-  Call back number 05/12/2014  Post procedure Call Back phone  # [phone]  Permission to leave phone message Yes     Patient questions:  Do you have a fever, pain , or abdominal swelling? No. Pain Score  0 *  Have you tolerated food without any problems? Yes.    Have you been able to return to your normal activities? Yes.    Do you have any questions about your discharge instructions: Diet   No. Medications  No. Follow up visit  Yes.    Do you have questions or concerns about your Care? No.  Actions: * If pain score is 4 or above: No action needed, pain <4. Patient did not have any questions regarding follow up visit.

## 2014-05-19 ENCOUNTER — Other Ambulatory Visit: Payer: Self-pay

## 2014-05-19 MED ORDER — DOXAZOSIN MESYLATE 2 MG PO TABS: 2.0000 mg | ORAL_TABLET | Freq: Every day | ORAL | Status: DC

## 2014-05-21 ENCOUNTER — Encounter (HOSPITAL_COMMUNITY): Payer: Self-pay | Admitting: Cardiovascular Disease

## 2014-05-27 ENCOUNTER — Other Ambulatory Visit: Payer: Self-pay | Admitting: *Deleted

## 2014-05-27 MED ORDER — HYDROCHLOROTHIAZIDE 25 MG PO TABS: 25.0000 mg | ORAL_TABLET | Freq: Every day | ORAL | Status: DC

## 2014-08-20 ENCOUNTER — Encounter: Payer: Self-pay | Admitting: Cardiology

## 2014-09-17 ENCOUNTER — Other Ambulatory Visit: Payer: Self-pay | Admitting: Dermatology

## 2014-10-13 ENCOUNTER — Encounter (HOSPITAL_COMMUNITY): Payer: Self-pay

## 2014-10-13 ENCOUNTER — Other Ambulatory Visit (HOSPITAL_BASED_OUTPATIENT_CLINIC_OR_DEPARTMENT_OTHER): Payer: Medicare Other

## 2014-10-13 ENCOUNTER — Ambulatory Visit (HOSPITAL_COMMUNITY)
Admission: RE | Admit: 2014-10-13 | Discharge: 2014-10-13 | Disposition: A | Discharge status: Home / Self Care | Payer: Medicare Other | Source: Ambulatory Visit | Attending: Oncology | Admitting: Oncology

## 2014-10-13 ENCOUNTER — Other Ambulatory Visit (HOSPITAL_COMMUNITY): Payer: Medicare Other

## 2014-10-13 DIAGNOSIS — Z85038 Personal history of other malignant neoplasm of large intestine: Secondary | ICD-10-CM | POA: Diagnosis not present

## 2014-10-13 DIAGNOSIS — C189 Malignant neoplasm of colon, unspecified: Secondary | ICD-10-CM

## 2014-10-13 LAB — COMPREHENSIVE METABOLIC PANEL (CC13)
ALT: 21 U/L (ref 0–55)
AST: 25 U/L (ref 5–34)
Albumin: 3.6 g/dL (ref 3.5–5.0)
Alkaline Phosphatase: 73 U/L (ref 40–150)
Anion Gap: 10 mEq/L (ref 3–11)
BUN: 10.7 mg/dL (ref 7.0–26.0)
CHLORIDE: 100 meq/L (ref 98–109)
CO2: 29 mEq/L (ref 22–29)
CREATININE: 0.8 mg/dL (ref 0.7–1.3)
Calcium: 9.2 mg/dL (ref 8.4–10.4)
EGFR: 84 mL/min/{1.73_m2} — ABNORMAL LOW (ref >90)
GLUCOSE: 107 mg/dL (ref 70–140)
Potassium: 3.5 mEq/L (ref 3.5–5.1)
Sodium: 138 mEq/L (ref 136–145)
Total Bilirubin: 1.36 mg/dL — ABNORMAL HIGH (ref 0.20–1.20)
Total Protein: 6.6 g/dL (ref 6.4–8.3)

## 2014-10-13 LAB — CBC WITH DIFFERENTIAL/PLATELET
BASO%: 0.4 % (ref 0.0–2.0)
BASOS ABS: 0 10*3/uL (ref 0.0–0.1)
EOS%: 1.5 % (ref 0.0–7.0)
Eosinophils Absolute: 0.1 10*3/uL (ref 0.0–0.5)
HEMATOCRIT: 37.7 % — AB (ref 38.4–49.9)
HGB: 13.4 g/dL (ref 13.0–17.1)
LYMPH%: 20.7 % (ref 14.0–49.0)
MCH: 33.8 pg — AB (ref 27.2–33.4)
MCHC: 35.5 g/dL (ref 32.0–36.0)
MCV: 95 fL (ref 79.3–98.0)
MONO#: 0.6 10*3/uL (ref 0.1–0.9)
MONO%: 12.8 % (ref 0.0–14.0)
NEUT#: 3 10*3/uL (ref 1.5–6.5)
NEUT%: 64.6 % (ref 39.0–75.0)
PLATELETS: 198 10*3/uL (ref 140–400)
RBC: 3.97 10*6/uL — ABNORMAL LOW (ref 4.20–5.82)
RDW: 13.6 % (ref 11.0–14.6)
WBC: 4.6 10*3/uL (ref 4.0–10.3)
lymph#: 1 10*3/uL (ref 0.9–3.3)

## 2014-10-13 MED ORDER — IOHEXOL 300 MG/ML  SOLN
100.0000 mL | Freq: Once | INTRAMUSCULAR | Status: AC | PRN
  Administered 2014-10-13: 100 mL via INTRAVENOUS

## 2014-10-14 LAB — CEA: CEA: 0.9 ng/mL (ref 0.0–5.0)

## 2014-10-20 ENCOUNTER — Ambulatory Visit (HOSPITAL_BASED_OUTPATIENT_CLINIC_OR_DEPARTMENT_OTHER): Payer: Medicare Other | Admitting: Oncology

## 2014-10-20 VITALS — BP 138/43 | HR 74 | Temp 97.8°F | Resp 18 | Ht 70.0 in | Wt 193.9 lb

## 2014-10-20 DIAGNOSIS — Z85038 Personal history of other malignant neoplasm of large intestine: Secondary | ICD-10-CM | POA: Diagnosis not present

## 2014-10-20 NOTE — Progress Notes (Signed)
Hematology and Oncology Follow Up Visit  Ronnie Robinson [id number] 09-24-1931 81 y.o. 10/20/2014 10:20 AM Tawanna Solo, MDMiller, Lattie Haw, MD   Principle Diagnosis: 81 year old with stage IIA well-differentiated adenocarcinoma of the right colon diagnosed in 12/2009.  Prior Therapy: S/P laparoscopic-assisted right hemicolectomy on 12/16/09.  Current therapy: Watchful observation  Interim History:  [name] returns for routine follow-up by himself. Since his last visit, he continues to complain of chronic peripheral neuropathy which has not changed. He has been doing very well otherwise.  He denies abdominal pain, nausea, and vomiting. No change in his bowel habits. No bleeding noted. Appetite is good. His weight is stable at this time. No chest pain, shortness of breath, or dyspnea on exertion. He has not reported any recent hospitalizations or illnesses. He has not reported any neurological symptoms of headaches or blurry vision. Has not reported any alteration of mental status. He has not reported any shortness of breath or cough or hemoptysis. Has not reported any orthopnea or palpitations. He does not report any mood disorder and the remaining review of systems unremarkable.  Medications: I have reviewed the patient's current medications.  Current Outpatient Prescriptions  Medication Sig Dispense Refill  . amLODipine (NORVASC) 5 MG tablet Take 1 tablet (5 mg total) by mouth daily. 90 tablet 2  . Ascorbic Acid (VITAMIN C WITH ROSE HIPS) 500 MG tablet Take 500 mg by mouth daily.     Marland Kitchen aspirin 325 MG tablet Take 325 mg by mouth daily.      Marland Kitchen atorvastatin (LIPITOR) 40 MG tablet Take 40 mg by mouth daily.     Marland Kitchen doxazosin (CARDURA) 2 MG tablet Take 1 tablet (2 mg total) by mouth at bedtime. 90 tablet 3  . fish oil-omega-3 fatty acids 1000 MG capsule Take 3 g by mouth 2 (two) times daily.     . hydrALAZINE (APRESOLINE) 100 MG tablet Take 100 mg by mouth 2 (two) times daily.     . hydrochlorothiazide  (HYDRODIURIL) 25 MG tablet Take 1 tablet (25 mg total) by mouth daily. 90 tablet 1  . ketoconazole (NIZORAL) 2 % cream Apply 1 application topically daily as needed for irritation.    . Misc Natural Products (OSTEO BI-FLEX ADV JOINT SHIELD PO) Take 1 tablet by mouth 2 (two) times daily.     . Multiple Vitamin (MULTIVITAMIN WITH MINERALS) TABS Take 1 tablet by mouth daily.    . Multiple Vitamins-Minerals (OCUVITE-LUTEIN PO) Take 1 tablet by mouth daily.     . pimecrolimus (ELIDEL) 1 % cream Apply 1 application topically as needed. For rosacea    . potassium chloride (K-DUR,KLOR-CON) 10 MEQ tablet Take 2 tablets (20 mEq total) by mouth daily. 180 tablet 3  . psyllium (METAMUCIL) 58.6 % powder Take 1 packet by mouth daily.     Marland Kitchen selenium sulfide (SELSUN) 2.5 % shampoo As directed  0  . vitamin B-12 (CYANOCOBALAMIN) 100 MCG tablet Take 100 mcg by mouth daily.     No current facility-administered medications for this visit.     Allergies:  Allergies  Allergen Reactions  . Micardis [Telmisartan] Anaphylaxis    Swelling to head, face, and throat.  . Metoprolol     Type I second degree AV block with beta blockers    Past Medical History, Surgical history, Social history, and Family History were reviewed and updated.    Physical Exam: Blood pressure 138/43, pulse 74, temperature 97.8 F (36.6 C), temperature source Oral, resp. rate 18, height  5\' 10"  (1.778 m), weight 193 lb 14.4 oz (87.952 kg), SpO2 98 %. ECOG: 0 General appearance: alert, cooperative and no distress Head: Normocephalic, without obvious abnormality, atraumatic Neck: no adenopathy, no carotid bruit, no JVD, supple, symmetrical, trachea midline and thyroid not enlarged, symmetric, no tenderness/mass/nodules Lymph nodes: Cervical, supraclavicular, and axillary nodes normal. Heart:regular rate and rhythm, S1, S2 normal, no murmur, click, rub or gallop Lung:chest clear, no wheezing, rales, normal symmetric air entry, no  tachypnea, retractions or cyanosis Abdomen: soft, non-tender, without masses or organomegaly EXT:no erythema, induration, or nodules   Lab Results: Lab Results  Component Value Date   WBC 4.6 10/13/2014   HGB 13.4 10/13/2014   HCT 37.7* 10/13/2014   MCV 95.0 10/13/2014   PLT 198 10/13/2014     Chemistry      Component Value Date/Time   NA 138 10/13/2014 1014   NA 133* 11/11/2013 1045   NA 138 10/23/2011 1138   K 3.5 10/13/2014 1014   K 3.6 11/11/2013 1045   K 4.0 10/23/2011 1138   CL 99 11/11/2013 1045   CL 98 07/16/2012 1151   CL 97* 10/23/2011 1138   CO2 29 10/13/2014 1014   CO2 27 11/11/2013 1045   CO2 27 10/23/2011 1138   BUN 10.7 10/13/2014 1014   BUN 12 11/11/2013 1045   BUN 16 10/23/2011 1138   CREATININE 0.8 10/13/2014 1014   CREATININE 0.9 11/11/2013 1045   CREATININE 0.8 10/23/2011 1138      Component Value Date/Time   CALCIUM 9.2 10/13/2014 1014   CALCIUM 9.2 11/11/2013 1045   CALCIUM 8.9 10/23/2011 1138   ALKPHOS 73 10/13/2014 1014   ALKPHOS 52 09/15/2013 0959   ALKPHOS 56 10/23/2011 1138   AST 25 10/13/2014 1014   AST 36 09/15/2013 0959   AST 33 10/23/2011 1138   ALT 21 10/13/2014 1014   ALT 32 09/15/2013 0959   ALT 32 10/23/2011 1138   BILITOT 1.36* 10/13/2014 1014   BILITOT 1.4* 09/15/2013 0959   BILITOT 1.50 10/23/2011 1138        EXAM: CT CHEST, ABDOMEN, AND PELVIS WITH CONTRAST  TECHNIQUE: Multidetector CT imaging of the chest, abdomen and pelvis was performed following the standard protocol during bolus administration of intravenous contrast.  CONTRAST: 165mL OMNIPAQUE IOHEXOL 300 MG/ML SOLN  COMPARISON: Abdominal CT 10/13/2013 and 10/22/2012. Chest CT 10/23/2011.  FINDINGS: CT CHEST FINDINGS  Mediastinum/Nodes: There are no enlarged mediastinal, hilar or axillary lymph nodes. There is stable nodularity within the right thyroid lobe and a stable small hiatal hernia. The heart size is normal. There is no pericardial  effusion.Moderate diffuse atherosclerosis of the aorta, great vessels and coronary arteries noted.  Lungs/Pleura: There is no pleural effusion.There is stable 4 mm left upper lobe nodule on image 26. Tiny left lower lobe nodule on image 55 is stable. There are no worrisome pulmonary findings.  Musculoskeletal/Chest wall: No evidence of chest wall mass or suspicious osseous findings. Bilateral gynecomastia noted.  CT ABDOMEN AND PELVIS FINDINGS  Hepatobiliary: The liver is normal in density without focal abnormality. Small calcified gallstones are again noted. There is no gallbladder wall thickening or significant biliary dilatation.  Pancreas: Stable appearance. Small cystic or hepatic lesion within the pancreatic head is stable. There is no pancreatic ductal dilatation or surrounding inflammatory change.  Spleen: Multiple splenic granulomas are again noted. There is no evidence of splenic lesion or enlargement.  Adrenals/Urinary Tract: Stable mild fullness of the left adrenal gland. The right adrenal gland  appears normal.The kidneys appear normal without evidence of urinary tract calculus, suspicious lesion or hydronephrosis. No bladder abnormalities are seen.  Stomach/Bowel: No evidence of bowel wall thickening, distention or surrounding inflammatory change.Stable postsurgical changes from previous right hemicolectomy. There is diffuse colonic diverticulosis.  Vascular/Lymphatic: There are no enlarged abdominal or pelvic lymph nodes. Small retroperitoneal lymph nodes are stable. There is fairly extensive atherosclerosis of the aorta, its branches and iliac arteries. There is potential ostial stenosis of the SMA. No large vessel occlusion identified. No significant venous abnormalities.  Reproductive: Prostatic enlargement appears mildly progressive. The prostate gland measures up to 6.7 cm transverse.  Other: No evidence of abdominal wall mass or  hernia.  Musculoskeletal: No acute or significant osseous findings. There are stable degenerative changes throughout the spine status post L3 through L5 fusion. A grade 1 anterolisthesis at L4-5 is stable.  IMPRESSION: 1. No evidence of metastatic disease within the chest, abdomen or pelvis status post right hemicolectomy. 2. Stable right thyroid nodularity. 3. Cholelithiasis. 4. Progressive prostatomegaly. 5. Diffuse atherosclerosis.     Impression and Plan: This is a 81 year old gentleman with the following issues:  1. Stage IIA colon cancer diagnosed in 2011. S/P laparoscopic-assisted hemicolectomy of the right colon. His last CT scan was done on 10/13/2014 was reviewed today and showed no evidence of disease.   He is close to 5 years out from his resected colon cancer and extremely unlikely to have a recurrence at this time. He does not require any further medical oncology follow-up and I will be happy to see him in the future as needed.  2. HTN. On Norvasc, HCTZ, and Hydralazine per PCP.  3. Colonoscopy surveillance: This was discussed with the patient today and given his age it is reasonable to stop surveillance colonoscopies. If he develops any new cancer he would probably be advanced in age that would not be a surgical candidate at that time. If he develops GI symptoms certainly I will be a different story at that time.  4. Follow-up. As needed.      YHCWCB,JSEGB 5/10/201610:20 AM

## 2014-10-28 ENCOUNTER — Other Ambulatory Visit: Payer: Self-pay | Admitting: Cardiology

## 2014-10-28 DIAGNOSIS — I6523 Occlusion and stenosis of bilateral carotid arteries: Secondary | ICD-10-CM

## 2014-11-06 ENCOUNTER — Encounter: Payer: Self-pay | Admitting: Cardiology

## 2014-11-06 ENCOUNTER — Ambulatory Visit (HOSPITAL_COMMUNITY): Payer: Medicare Other | Attending: Cardiology

## 2014-11-06 ENCOUNTER — Ambulatory Visit (INDEPENDENT_AMBULATORY_CARE_PROVIDER_SITE_OTHER): Payer: Medicare Other | Admitting: Cardiology

## 2014-11-06 VITALS — BP 130/88 | HR 53 | Ht 70.0 in | Wt 192.0 lb

## 2014-11-06 DIAGNOSIS — I779 Disorder of arteries and arterioles, unspecified: Secondary | ICD-10-CM | POA: Diagnosis not present

## 2014-11-06 DIAGNOSIS — E785 Hyperlipidemia, unspecified: Secondary | ICD-10-CM

## 2014-11-06 DIAGNOSIS — I10 Essential (primary) hypertension: Secondary | ICD-10-CM | POA: Diagnosis not present

## 2014-11-06 DIAGNOSIS — I2510 Atherosclerotic heart disease of native coronary artery without angina pectoris: Secondary | ICD-10-CM | POA: Diagnosis not present

## 2014-11-06 DIAGNOSIS — I6523 Occlusion and stenosis of bilateral carotid arteries: Secondary | ICD-10-CM | POA: Insufficient documentation

## 2014-11-06 DIAGNOSIS — I739 Peripheral vascular disease, unspecified: Secondary | ICD-10-CM

## 2014-11-06 DIAGNOSIS — I2583 Coronary atherosclerosis due to lipid rich plaque: Principal | ICD-10-CM

## 2014-11-06 NOTE — Progress Notes (Signed)
Cardiology Office Note   Date:  11/06/2014   ID:  Ronnie Robinson, DOB 1932/06/10, MRN [id number]  PCP:  Tawanna Solo, MD    Chief Complaint  Patient presents with  . Follow-up    CAD      History of Present Illness:  Ronnie Robinson is a 81 y.o. male with a history of coronary artery calcifications, HTN, dyslipidemia and carotid artery stenosis,mild nonobstructive ASCAD with a 30-40% ostial left circ, 50% first diagonal and normal LVF and noncardiac CP. He now presents back for followup. He denies any chest pain, SOB, DOE, dizziness, palpitations, LE edema or syncope.   Past Medical History  Diagnosis Date  . Hypertension   . Hyperlipidemia     coronary artery calcification on Chest CT's goal LDL <70  . Neuromuscular disorder   . Colon cancer dx'd 12/2009    a. 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 CT scan 10/2012 no evidence of disease.  . Arthritis   . Anemia     unknown etiology since 2007 Dr. Deatra Ina Nl CBC 6/11, ? Anemia of chronic disease  . Osteoarthritis   . Thyroid nodule      cystic and solid nodules 12/11, f/u in 6-12 mos;9/12 Stable B nodules, left mid lobe with nodule with calc 42mm nodule, consider bx, per D.r Buddy Duty, recheck u/s in 6-12 mos 3/13-9/13; 5/13 Stable nodules, thyroid slightly smaller  . Paresthesia   . Edema   . Lumbar degenerative disc disease   . 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 Deatra Ina, Dr.Gross, margins neg, nodes neg 13/13, Dr. Jamse Arn  . Carotid artery stenosis     a. 50-69% BICA by duplex 04/2013 - additionally, mild RECA stenosis, mild bilateral subclavian artery stenosis.  . Erectile dysfunction   . Obesity (BMI 30-39.9)   . Pulmonary nodule   . Sigmoid diverticulosis   . 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  Marland Kitchen CAD (coronary artery disease)     a. Coronary artery calcifications on chest CT  2013 incidentally noted. b. nuclear stress test 2013 with no ischemia.  Cath 2015 showed nonobstructive ASCAD with 50% diagonal and 30% left circ  . Cataract     Past Surgical History  Procedure Laterality Date  . Tonsillectomy and adenoidectomy    . Colonoscopy    . Polypectomy    . Upper gastrointestinal endoscopy    . Colon resection  2011  . Posterior fusion lumbar spine  07/22/2012    Dr  Luiz Ochoa  . Cataract extraction Bilateral   . Mohs surgery      face  . Left heart catheterization with coronary angiogram N/A 11/13/2013    Procedure: LEFT HEART CATHETERIZATION WITH CORONARY ANGIOGRAM;  Surgeon: Blane Ohara, MD;  Location: [facility] CATH LAB;  Service: Cardiovascular;  Laterality: N/A;     Current Outpatient Prescriptions  Medication Sig Dispense Refill  . amLODipine (NORVASC) 5 MG tablet Take 1 tablet (5 mg total) by mouth daily. 90 tablet 2  . Ascorbic Acid (VITAMIN C WITH ROSE HIPS) 500 MG tablet Take 500 mg by mouth daily.     Marland Kitchen aspirin 325 MG tablet Take 325 mg by mouth daily.      Marland Kitchen atorvastatin (LIPITOR) 40 MG tablet Take 40 mg by mouth daily.     Marland Kitchen doxazosin (CARDURA) 2 MG tablet Take  1 tablet (2 mg total) by mouth at bedtime. 90 tablet 3  . fish oil-omega-3 fatty acids 1000 MG capsule Take 3 g by mouth 2 (two) times daily.     . hydrALAZINE (APRESOLINE) 100 MG tablet Take 100 mg by mouth 2 (two) times daily.     . hydrochlorothiazide (HYDRODIURIL) 25 MG tablet Take 1 tablet (25 mg total) by mouth daily. 90 tablet 1  . ketoconazole (NIZORAL) 2 % cream Apply 1 application topically daily as needed for irritation.    . Misc Natural Products (OSTEO BI-FLEX ADV JOINT SHIELD PO) Take 1 tablet by mouth 2 (two) times daily.     . Multiple Vitamin (MULTIVITAMIN WITH MINERALS) TABS Take 1 tablet by mouth daily.    . Multiple Vitamins-Minerals (OCUVITE-LUTEIN PO) Take 1 tablet by mouth daily.     . pimecrolimus (ELIDEL) 1 % cream Apply 1 application topically as needed. For rosacea     . potassium chloride (K-DUR,KLOR-CON) 10 MEQ tablet Take 2 tablets (20 mEq total) by mouth daily. 180 tablet 3  . psyllium (METAMUCIL) 58.6 % powder Take 1 packet by mouth daily.     Marland Kitchen selenium sulfide (SELSUN) 2.5 % shampoo As directed  0  . vitamin B-12 (CYANOCOBALAMIN) 100 MCG tablet Take 100 mcg by mouth daily.     No current facility-administered medications for this visit.    Allergies:   Micardis; Atenolol; and Metoprolol    Social History:  The patient  reports that he quit smoking about 56 years ago. His smoking use included Cigarettes. He has a 5 pack-year smoking history. He has never used smokeless tobacco. He reports that he drinks about 3.5 oz of alcohol per week. He reports that he does not use illicit drugs.   Family History:  The patient's family history includes Cancer - Prostate in his father; Mastocytosis in his brother; Skin cancer in his father.    ROS:  Please see the history of present illness.   Otherwise, review of systems are positive for back pain.   All other systems are reviewed and negative.    PHYSICAL EXAM: VS:  BP 130/88 mmHg  Pulse 53  Ht 5\' 10"  (1.778 m)  Wt 192 lb (87.091 kg)  BMI 27.55 kg/m2 , BMI Body mass index is 27.55 kg/(m^2). GEN: Well nourished, well developed, in no acute distress HEENT: normal Neck: no JVD, carotid bruits, or masses Cardiac: RRR; no murmurs, rubs, or gallops,no edema  Respiratory:  clear to auscultation bilaterally, normal work of breathing GI: soft, nontender, nondistended, + BS MS: no deformity or atrophy Skin: warm and dry, no rash Neuro:  Strength and sensation are intact Psych: euthymic mood, full affect   EKG:  EKG is ordered today. The ekg ordered today demonstrates sinus bradycardia at 53bpm with 1st degree AV block   Recent Labs: 10/13/2014: ALT 21; BUN 10.7; Creatinine 0.8; Hemoglobin 13.4; Platelets 198; Potassium 3.5; Sodium 138    Lipid Panel    Component Value Date/Time   CHOL 168  09/15/2013 0959   TRIG 33.0 09/15/2013 0959   HDL 92.20 09/15/2013 0959   CHOLHDL 2 09/15/2013 0959   VLDL 6.6 09/15/2013 0959   LDLCALC 69 09/15/2013 0959      Wt Readings from Last 3 Encounters:  11/06/14 192 lb (87.091 kg)  10/20/14 193 lb 14.4 oz (87.952 kg)  05/12/14 193 lb (87.544 kg)    ASSESSMENT AND PLAN:   1. Nonobstructive ASCAD by cath. He denies any chest pain or  SOB - continue ASA  2. HTN - controlled - continue Amlodipine/doxazosin/hydralazine/HCTZ  3. Dyslipidemia - at goal - continue fish oil/lipitor  - I will get a copy of lipids from his PCP 4. Moderate Carotid artery stenosis which is stable by exam today with 40-59% RICA and 38-33% LICA stenosis on ASA - repeat dopplers in 1 year 5. 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  The patient does not have concerns regarding medicines.  The following changes have been made:  no change  Labs/ tests ordered today include: see above assessment and plan No orders of the defined types were placed in this encounter.     Disposition:   FU with me in 6 months   Signed, Sueanne Margarita, MD  11/06/2014 9:20 AM    [facility] [address], [place], [place]  [address] Phone: [phone]; Fax: [phone]

## 2014-11-06 NOTE — Patient Instructions (Signed)
Medication Instructions:  Your physician recommends that you continue on your current medications as directed. Please refer to the Current Medication list given to you today.   Labwork: None  Testing/Procedures: Your physician has requested that you have a carotid duplex IN ONE YEAR. This test is an ultrasound of the carotid arteries in your neck. It looks at blood flow through these arteries that supply the brain with blood. Allow one hour for this exam. There are no restrictions or special instructions.  Follow-Up: Your physician wants you to follow-up in: 6 months with Dr. Radford Pax. You will receive a reminder letter in the mail two months in advance. If you don't receive a letter, please call our office to schedule the follow-up appointment.   Any Other Special Instructions Will Be Listed Below (If Applicable).

## 2014-11-18 ENCOUNTER — Other Ambulatory Visit: Payer: Self-pay

## 2014-11-19 ENCOUNTER — Encounter: Payer: Self-pay | Admitting: Cardiology

## 2014-11-20 ENCOUNTER — Telehealth: Payer: Self-pay

## 2014-11-20 DIAGNOSIS — I779 Disorder of arteries and arterioles, unspecified: Secondary | ICD-10-CM

## 2014-11-20 DIAGNOSIS — I739 Peripheral vascular disease, unspecified: Principal | ICD-10-CM

## 2014-11-20 NOTE — Addendum Note (Signed)
Addended by: Harland German A on: 11/20/2014 05:55 PM   Modules accepted: Orders

## 2014-11-20 NOTE — Telephone Encounter (Signed)
-----   Message from Sueanne Margarita, MD sent at 11/08/2014 12:47 PM EDT ----- 40-59% RICA and 70-26% LICA stenosis - with persistence of anechoic areas in thyroid stable from prior scan - please forward to PCP for thyroid abnormality and repeat dopplers in 1 year

## 2014-11-20 NOTE — Telephone Encounter (Addendum)
Informed patient of results and verbal understanding expressed.  Repeat dopplers scheduled 10/25/15. Patient agrees with treatment plan.

## 2014-12-04 ENCOUNTER — Other Ambulatory Visit: Payer: Self-pay

## 2014-12-04 MED ORDER — HYDROCHLOROTHIAZIDE 25 MG PO TABS: 25.0000 mg | ORAL_TABLET | Freq: Every day | ORAL | Status: DC

## 2015-01-18 ENCOUNTER — Encounter: Payer: Self-pay | Admitting: Gastroenterology

## 2015-01-28 ENCOUNTER — Other Ambulatory Visit: Payer: Self-pay | Admitting: *Deleted

## 2015-01-28 MED ORDER — POTASSIUM CHLORIDE CRYS ER 10 MEQ PO TBCR: 20.0000 meq | EXTENDED_RELEASE_TABLET | Freq: Every day | ORAL | Status: DC

## 2015-04-13 ENCOUNTER — Other Ambulatory Visit: Payer: Self-pay | Admitting: Cardiology

## 2015-04-27 ENCOUNTER — Other Ambulatory Visit: Payer: Self-pay | Admitting: Cardiology

## 2015-04-28 ENCOUNTER — Other Ambulatory Visit: Payer: Self-pay | Admitting: *Deleted

## 2015-04-28 MED ORDER — POTASSIUM CHLORIDE CRYS ER 10 MEQ PO TBCR: 20.0000 meq | EXTENDED_RELEASE_TABLET | Freq: Every day | ORAL | Status: DC

## 2015-05-05 ENCOUNTER — Encounter: Payer: Self-pay | Admitting: Cardiology

## 2015-05-17 ENCOUNTER — Other Ambulatory Visit: Payer: Self-pay | Admitting: Cardiology

## 2015-05-21 ENCOUNTER — Ambulatory Visit (INDEPENDENT_AMBULATORY_CARE_PROVIDER_SITE_OTHER): Payer: Medicare Other | Admitting: Cardiology

## 2015-05-21 ENCOUNTER — Encounter: Payer: Self-pay | Admitting: Cardiology

## 2015-05-21 VITALS — BP 130/58 | HR 69 | Ht 70.0 in | Wt 192.4 lb

## 2015-05-21 DIAGNOSIS — I10 Essential (primary) hypertension: Secondary | ICD-10-CM | POA: Diagnosis not present

## 2015-05-21 DIAGNOSIS — I739 Peripheral vascular disease, unspecified: Secondary | ICD-10-CM

## 2015-05-21 DIAGNOSIS — I2510 Atherosclerotic heart disease of native coronary artery without angina pectoris: Secondary | ICD-10-CM | POA: Diagnosis not present

## 2015-05-21 DIAGNOSIS — E785 Hyperlipidemia, unspecified: Secondary | ICD-10-CM | POA: Diagnosis not present

## 2015-05-21 DIAGNOSIS — I779 Disorder of arteries and arterioles, unspecified: Secondary | ICD-10-CM

## 2015-05-21 DIAGNOSIS — I2583 Coronary atherosclerosis due to lipid rich plaque: Principal | ICD-10-CM

## 2015-05-21 NOTE — Patient Instructions (Signed)

## 2015-05-21 NOTE — Progress Notes (Signed)
Cardiology Office Note   Date:  05/21/2015   ID:  Ronnie Robinson, DOB [birth year], MRN [id number]  PCP:  Tawanna Solo, MD    Chief Complaint  Patient presents with  . Coronary Artery Disease    pt has no complaints  . Hypertension      History of Present Illness: Ronnie Robinson is a 81 y.o. male with a history of coronary artery calcifications, HTN, dyslipidemia and carotid artery stenosis,mild nonobstructive ASCAD with a 30-40% ostial left circ, 50% first diagonal and normal LVF and noncardiac CP. He now presents back for followup. He denies any chest pain, SOB, DOE, dizziness, claudications, palpitations, LE edema or syncope. He works with PT for his exercise.    Past Medical History  Diagnosis Date  . Hypertension   . Hyperlipidemia     coronary artery calcification on Chest CT's goal LDL <70  . Neuromuscular disorder ([place])   . Colon cancer ([place]) dx'd 12/2009    a. 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 CT scan 10/2012 no evidence of disease.  . Arthritis   . Anemia     unknown etiology since 2007 Dr. Deatra Ina Nl CBC 6/11, ? Anemia of chronic disease  . Osteoarthritis   . Thyroid nodule      cystic and solid nodules 12/11, f/u in 6-12 mos;9/12 Stable B nodules, left mid lobe with nodule with calc 58mm nodule, consider bx, per D.r Buddy Duty, recheck u/s in 6-12 mos 3/13-9/13; 5/13 Stable nodules, thyroid slightly smaller  . Paresthesia   . Edema   . Lumbar degenerative disc disease   . 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 Deatra Ina, Dr.Gross, margins neg, nodes neg 13/13, Dr. Jamse Arn  . Carotid artery stenosis     a. 50-69% BICA by duplex 04/2013 - additionally, mild RECA stenosis, mild bilateral subclavian artery stenosis.  . Erectile dysfunction   . Obesity (BMI 30-39.9)   . Pulmonary nodule   . Sigmoid diverticulosis   . 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 H/o 2nd degree  type 1 block with metoprolol per chart.  Marland Kitchen CAD (coronary artery disease)     a. Coronary artery calcifications on chest CT 2013 incidentally noted. b. nuclear stress test 2013 with no ischemia.  Cath 2015 showed nonobstructive ASCAD with 50% diagonal and 30% left circ  . Cataract     Past Surgical History  Procedure Laterality Date  . Tonsillectomy and adenoidectomy    . Colonoscopy    . Polypectomy    . Upper gastrointestinal endoscopy    . Colon resection  2011  . Posterior fusion lumbar spine  07/22/2012    Dr  Luiz Ochoa  . Cataract extraction Bilateral   . Mohs surgery      face  . Left heart catheterization with coronary angiogram N/A 11/13/2013    Procedure: LEFT HEART CATHETERIZATION WITH CORONARY ANGIOGRAM;  Surgeon: Blane Ohara, MD;  Location: [facility] CATH LAB;  Service: Cardiovascular;  Laterality: N/A;     Current Outpatient Prescriptions  Medication Sig Dispense Refill  . amLODipine (NORVASC) 5 MG tablet TAKE ONE TABLET BY MOUTH ONCE DAILY. 90 tablet 1  . Ascorbic Acid (VITAMIN C WITH ROSE HIPS) 500 MG tablet Take 500 mg by mouth daily.     Marland Kitchen aspirin 325 MG tablet Take 325 mg by  mouth daily.      Marland Kitchen atorvastatin (LIPITOR) 40 MG tablet Take 40 mg by mouth daily.     Marland Kitchen doxazosin (CARDURA) 2 MG tablet TAKE ONE TABLET BY MOUTH AT BEDTIME 90 tablet 1  . fish oil-omega-3 fatty acids 1000 MG capsule Take 3 g by mouth 2 (two) times daily.     . hydrALAZINE (APRESOLINE) 100 MG tablet Take 100 mg by mouth 2 (two) times daily.     . hydrochlorothiazide (HYDRODIURIL) 25 MG tablet Take 1 tablet (25 mg total) by mouth daily. 90 tablet 1  . ketoconazole (NIZORAL) 2 % cream Apply 1 application topically daily as needed for irritation.    . Misc Natural Products (OSTEO BI-FLEX ADV JOINT SHIELD PO) Take 1 tablet by mouth 2 (two) times daily.     . Multiple Vitamin (MULTIVITAMIN WITH MINERALS) TABS Take 1 tablet by mouth daily.    . Multiple Vitamins-Minerals (OCUVITE-LUTEIN PO) Take 1 tablet  by mouth daily.     . pimecrolimus (ELIDEL) 1 % cream Apply 1 application topically as needed. For rosacea    . potassium chloride (K-DUR,KLOR-CON) 10 MEQ tablet Take 2 tablets (20 mEq total) by mouth daily. 180 tablet 0  . psyllium (METAMUCIL) 58.6 % powder Take 1 packet by mouth daily.     Marland Kitchen selenium sulfide (SELSUN) 2.5 % shampoo As directed  0  . vitamin B-12 (CYANOCOBALAMIN) 100 MCG tablet Take 100 mcg by mouth daily.     No current facility-administered medications for this visit.    Allergies:   Micardis; Atenolol; and Metoprolol    Social History:  The patient  reports that he quit smoking about 56 years ago. His smoking use included Cigarettes. He has a 5 pack-year smoking history. He has never used smokeless tobacco. He reports that he drinks about 3.5 oz of alcohol per week. He reports that he does not use illicit drugs.   Family History:  The patient's family history includes Cancer - Prostate in his father; Mastocytosis in his brother; Skin cancer in his father.    ROS:  Please see the history of present illness.   Otherwise, review of systems are positive for none.   All other systems are reviewed and negative.    PHYSICAL EXAM: VS:  BP 130/58 mmHg  Pulse 69  Ht 5\' 10"  (1.778 m)  Wt 192 lb 6.4 oz (87.272 kg)  BMI 27.61 kg/m2  SpO2 98% , BMI Body mass index is 27.61 kg/(m^2). GEN: Well nourished, well developed, in no acute distress HEENT: normal Neck: no JVD, carotid bruits, or masses Cardiac: RRR; no murmurs, rubs, or gallops,no edema  Respiratory:  clear to auscultation bilaterally, normal work of breathing GI: soft, nontender, nondistended, + BS MS: no deformity or atrophy Skin: warm and dry, no rash Neuro:  Strength and sensation are intact Psych: euthymic mood, full affect   EKG:  EKG is not ordered today.    Recent Labs: 10/13/2014: ALT 21; BUN 10.7; Creatinine 0.8; HGB 13.4; Platelets 198; Potassium 3.5; Sodium 138    Lipid Panel    Component Value  Date/Time   CHOL 168 09/15/2013 0959   TRIG 33.0 09/15/2013 0959   HDL 92.20 09/15/2013 0959   CHOLHDL 2 09/15/2013 0959   VLDL 6.6 09/15/2013 0959   LDLCALC 69 09/15/2013 0959      Wt Readings from Last 3 Encounters:  05/21/15 192 lb 6.4 oz (87.272 kg)  11/06/14 192 lb (87.091 kg)  10/20/14 193 lb 14.4 oz (  87.952 kg)     ASSESSMENT AND PLAN:   1. Nonobstructive ASCAD by cath. He denies any chest pain or SOB - continue ASA  2. HTN - controlled - continue Amlodipine/doxazosin/hydralazine/HCTZ  3. Dyslipidemia - at goal - continue fish oil/lipitor  4. Moderate Carotid artery stenosis which is stable by exam today with 40-59% RICA and [id number] LICA stenosis on ASA - repeat dopplers 10/2015  Current medicines are reviewed at length with the patient today.  The patient does not have concerns regarding medicines.  The following changes have been made:  no change  Labs/ tests ordered today: See above Assessment and Plan No orders of the defined types were placed in this encounter.     Disposition:   FU with me in 1 year  Signed, Sueanne Margarita, MD  05/21/2015 10:57 AM    [facility] [place], [place], [place]  [address] Phone: [phone]; Fax: [phone]

## 2015-05-26 ENCOUNTER — Other Ambulatory Visit: Payer: Self-pay | Admitting: Cardiology

## 2015-07-28 ENCOUNTER — Other Ambulatory Visit: Payer: Self-pay | Admitting: Cardiology

## 2015-10-18 ENCOUNTER — Other Ambulatory Visit: Payer: Self-pay | Admitting: Cardiology

## 2015-10-20 ENCOUNTER — Other Ambulatory Visit: Payer: Self-pay | Admitting: Cardiology

## 2015-10-25 ENCOUNTER — Ambulatory Visit (HOSPITAL_COMMUNITY)
Admission: RE | Admit: 2015-10-25 | Discharge: 2015-10-25 | Disposition: A | Discharge status: Home / Self Care | Payer: Medicare Other | Source: Ambulatory Visit | Attending: Cardiology | Admitting: Cardiology

## 2015-10-25 ENCOUNTER — Encounter (HOSPITAL_COMMUNITY): Payer: Medicare Other

## 2015-10-25 ENCOUNTER — Encounter: Payer: Self-pay | Admitting: Cardiology

## 2015-10-25 DIAGNOSIS — I6523 Occlusion and stenosis of bilateral carotid arteries: Secondary | ICD-10-CM | POA: Diagnosis not present

## 2015-10-25 DIAGNOSIS — I779 Disorder of arteries and arterioles, unspecified: Secondary | ICD-10-CM | POA: Insufficient documentation

## 2015-10-25 DIAGNOSIS — I739 Peripheral vascular disease, unspecified: Secondary | ICD-10-CM

## 2015-10-25 DIAGNOSIS — E785 Hyperlipidemia, unspecified: Secondary | ICD-10-CM | POA: Diagnosis not present

## 2015-10-25 DIAGNOSIS — I10 Essential (primary) hypertension: Secondary | ICD-10-CM | POA: Insufficient documentation

## 2015-10-25 DIAGNOSIS — I119 Hypertensive heart disease without heart failure: Secondary | ICD-10-CM | POA: Diagnosis present

## 2015-10-26 ENCOUNTER — Telehealth: Payer: Self-pay | Admitting: Cardiology

## 2015-10-26 DIAGNOSIS — I779 Disorder of arteries and arterioles, unspecified: Secondary | ICD-10-CM

## 2015-10-26 DIAGNOSIS — I739 Peripheral vascular disease, unspecified: Principal | ICD-10-CM

## 2015-10-26 NOTE — Telephone Encounter (Signed)
Left message to call back  

## 2015-10-26 NOTE — Telephone Encounter (Signed)
Informed patient of results and verbal understanding expressed.  Repeat carotids ordered to be scheduled in 6 months. Patient agrees with treatment plan.  

## 2015-10-26 NOTE — Telephone Encounter (Signed)
-----   Message from Sueanne Margarita, MD sent at 10/25/2015  2:58 PM EDT ----- 40-59% right and 50-69% left carotid artery stenosis.  Repeat scan in 6 months.

## 2015-10-26 NOTE — Telephone Encounter (Signed)
Follow up ° °Pt returned call  °

## 2015-10-26 NOTE — Telephone Encounter (Signed)
Pt is calling back

## 2015-11-02 ENCOUNTER — Other Ambulatory Visit: Payer: Self-pay

## 2015-11-02 MED ORDER — DOXAZOSIN MESYLATE 2 MG PO TABS: 2.0000 mg | ORAL_TABLET | Freq: Every day | ORAL | Status: DC

## 2015-12-08 ENCOUNTER — Other Ambulatory Visit: Payer: Self-pay | Admitting: Cardiology

## 2016-03-01 ENCOUNTER — Other Ambulatory Visit: Payer: Self-pay | Admitting: *Deleted

## 2016-03-01 MED ORDER — POTASSIUM CHLORIDE CRYS ER 10 MEQ PO TBCR: EXTENDED_RELEASE_TABLET | ORAL | 0 refills | Status: DC

## 2016-04-07 ENCOUNTER — Other Ambulatory Visit: Payer: Self-pay | Admitting: *Deleted

## 2016-04-07 MED ORDER — AMLODIPINE BESYLATE 5 MG PO TABS: 5.0000 mg | ORAL_TABLET | Freq: Every day | ORAL | 0 refills | Status: DC

## 2016-04-25 ENCOUNTER — Other Ambulatory Visit: Payer: Self-pay | Admitting: Cardiology

## 2016-04-26 ENCOUNTER — Ambulatory Visit (HOSPITAL_COMMUNITY)
Admission: RE | Admit: 2016-04-26 | Discharge: 2016-04-26 | Disposition: A | Discharge status: Home / Self Care | Payer: Medicare Other | Source: Ambulatory Visit | Attending: Cardiology | Admitting: Cardiology

## 2016-04-26 DIAGNOSIS — I10 Essential (primary) hypertension: Secondary | ICD-10-CM | POA: Diagnosis not present

## 2016-04-26 DIAGNOSIS — I2510 Atherosclerotic heart disease of native coronary artery without angina pectoris: Secondary | ICD-10-CM | POA: Diagnosis not present

## 2016-04-26 DIAGNOSIS — E785 Hyperlipidemia, unspecified: Secondary | ICD-10-CM | POA: Insufficient documentation

## 2016-04-26 DIAGNOSIS — F17200 Nicotine dependence, unspecified, uncomplicated: Secondary | ICD-10-CM | POA: Diagnosis not present

## 2016-04-26 DIAGNOSIS — I6523 Occlusion and stenosis of bilateral carotid arteries: Secondary | ICD-10-CM | POA: Insufficient documentation

## 2016-04-26 DIAGNOSIS — I779 Disorder of arteries and arterioles, unspecified: Secondary | ICD-10-CM | POA: Diagnosis present

## 2016-04-26 DIAGNOSIS — I739 Peripheral vascular disease, unspecified: Secondary | ICD-10-CM

## 2016-04-26 NOTE — Telephone Encounter (Signed)
potassium chloride (KLOR-CON M10) 10 MEQ tablet  Medication  Date: 03/01/2016 Department: [address] Office Ordering/Authorizing: Sueanne Margarita, MD  Order Providers   Prescribing Provider Encounter Provider  Sueanne Margarita, MD Juventino Slovak, CMA  Medication Detail    Disp Refills Start End   potassium chloride (KLOR-CON M10) 10 MEQ tablet 180 tablet 0 03/01/2016    Sig: Take two tablets by mouth once daily. Please call and schedule an appointment for December   E-Prescribing Status: Receipt confirmed by pharmacy (03/01/2016 11:42 AM EDT)   Pharmacy   [id number] - [place], [place] - [place]

## 2016-04-27 ENCOUNTER — Encounter: Payer: Self-pay | Admitting: Cardiology

## 2016-04-28 ENCOUNTER — Encounter: Payer: Self-pay | Admitting: Cardiology

## 2016-05-01 ENCOUNTER — Telehealth: Payer: Self-pay

## 2016-05-01 DIAGNOSIS — I779 Disorder of arteries and arterioles, unspecified: Secondary | ICD-10-CM

## 2016-05-01 DIAGNOSIS — I739 Peripheral vascular disease, unspecified: Principal | ICD-10-CM

## 2016-05-01 NOTE — Telephone Encounter (Signed)
-----   Message from Sueanne Margarita, MD sent at 04/28/2016  2:12 PM EST ----- Stable 40-59% bilateral carotid artery stenosis - repeat dopplers in 1 year

## 2016-05-01 NOTE — Telephone Encounter (Signed)
Informed patient of results and verbal understanding expressed.  Repeat carotids ordered to be scheduled in 1 year. Patient agrees with treatment plan. 

## 2016-05-17 ENCOUNTER — Encounter: Payer: Self-pay | Admitting: Cardiology

## 2016-05-17 ENCOUNTER — Other Ambulatory Visit: Payer: Self-pay | Admitting: Cardiology

## 2016-05-22 ENCOUNTER — Ambulatory Visit (INDEPENDENT_AMBULATORY_CARE_PROVIDER_SITE_OTHER): Payer: Medicare Other | Admitting: Cardiology

## 2016-05-22 ENCOUNTER — Encounter (INDEPENDENT_AMBULATORY_CARE_PROVIDER_SITE_OTHER): Payer: Self-pay

## 2016-05-22 VITALS — BP 118/60 | HR 78 | Ht 70.0 in | Wt 186.0 lb

## 2016-05-22 DIAGNOSIS — E7800 Pure hypercholesterolemia, unspecified: Secondary | ICD-10-CM

## 2016-05-22 DIAGNOSIS — I2510 Atherosclerotic heart disease of native coronary artery without angina pectoris: Secondary | ICD-10-CM | POA: Diagnosis not present

## 2016-05-22 DIAGNOSIS — I10 Essential (primary) hypertension: Secondary | ICD-10-CM

## 2016-05-22 DIAGNOSIS — I739 Peripheral vascular disease, unspecified: Secondary | ICD-10-CM

## 2016-05-22 DIAGNOSIS — I779 Disorder of arteries and arterioles, unspecified: Secondary | ICD-10-CM

## 2016-05-22 DIAGNOSIS — R011 Cardiac murmur, unspecified: Secondary | ICD-10-CM | POA: Insufficient documentation

## 2016-05-22 MED ORDER — AMLODIPINE BESYLATE 5 MG PO TABS: 5.0000 mg | ORAL_TABLET | Freq: Every day | ORAL | 3 refills | Status: DC

## 2016-05-22 MED ORDER — DOXAZOSIN MESYLATE 2 MG PO TABS: 2.0000 mg | ORAL_TABLET | Freq: Every day | ORAL | 3 refills | Status: DC

## 2016-05-22 MED ORDER — POTASSIUM CHLORIDE CRYS ER 10 MEQ PO TBCR: 20.0000 meq | EXTENDED_RELEASE_TABLET | Freq: Every day | ORAL | 3 refills | Status: DC

## 2016-05-22 MED ORDER — HYDROCHLOROTHIAZIDE 25 MG PO TABS: 25.0000 mg | ORAL_TABLET | Freq: Every day | ORAL | 3 refills | Status: DC

## 2016-05-22 NOTE — Progress Notes (Signed)
Cardiology Office Note    Date:  05/22/2016   ID:  Ronnie Robinson, DOB 10/23/1931, MRN [id number]  PCP:  Tawanna Solo, MD  Cardiologist:  Fransico Him, MD   Chief Complaint  Patient presents with  . Coronary Artery Disease  . Hypertension  . Hyperlipidemia    History of Present Illness:  Ronnie Robinson is a 82 y.o. male with a history of coronary artery calcifications, HTN, dyslipidemia and carotid artery stenosis,mild nonobstructive ASCAD with a 30-40% ostial left circ, 50% first diagonal and normal LVF and noncardiac CP. He now presents back for followup. He denies any chest pain, SOB, DOE, dizziness, claudications, palpitations, LE edema or syncope. He works with PT for his exercise.    Past Medical History:  Diagnosis Date  . Anemia    unknown etiology since 2007 Dr. Deatra Ina Nl CBC 6/11, ? Anemia of chronic disease  . Arthritis   . AV block    1st degree AVB at baseline. 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  Marland Kitchen CAD (coronary artery disease)    a. Coronary artery calcifications on chest CT 2013 incidentally noted. b. nuclear stress test 2013 with no ischemia.  Cath 2015 showed nonobstructive ASCAD with 50% diagonal and 30% left circ  . Carotid artery stenosis    a. 50-69% BICA by duplex 04/2013 - additionally, mild RECA stenosis, mild bilateral subclavian artery stenosis.  . Carotid artery stenosis    40-59% bilateral carotid stenosis by dopplers 04/2016  . Cataract   . Colon cancer ([place]) dx'd 12/2009   a. 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 CT scan 10/2012 no evidence of disease.  . Colon polyp    adenomatous 2007, nl 2008, polyps adenocarcinoma 6/11, Dr. Deatra Ina, Dr.Gross, margins neg, nodes neg 13/13, Dr. Jamse Arn  . Edema   . Erectile dysfunction   . Hyperlipidemia    coronary artery calcification on Chest CT's goal LDL <70  . Hypertension   . Lumbar degenerative disc disease   . Neuromuscular disorder  ([place])   . Obesity (BMI 30-39.9)   . Osteoarthritis   . Paresthesia   . Pulmonary nodule   . Sigmoid diverticulosis   . Thyroid nodule     cystic and solid nodules 12/11, f/u in 6-12 mos;9/12 Stable B nodules, left mid lobe with nodule with calc 31mm nodule, consider bx, per D.r Buddy Duty, recheck u/s in 6-12 mos 3/13-9/13; 5/13 Stable nodules, thyroid slightly smaller    Past Surgical History:  Procedure Laterality Date  . CATARACT EXTRACTION Bilateral   . COLON RESECTION  2011  . COLONOSCOPY    . LEFT HEART CATHETERIZATION WITH CORONARY ANGIOGRAM N/A 11/13/2013   Procedure: LEFT HEART CATHETERIZATION WITH CORONARY ANGIOGRAM;  Surgeon: Blane Ohara, MD;  Location: [facility] CATH LAB;  Service: Cardiovascular;  Laterality: N/A;  . MOHS SURGERY     face  . POLYPECTOMY    . POSTERIOR FUSION LUMBAR SPINE  07/22/2012   Dr  Luiz Ochoa  . TONSILLECTOMY AND ADENOIDECTOMY    . UPPER GASTROINTESTINAL ENDOSCOPY      Current Medications: Outpatient Medications Prior to Visit  Medication Sig Dispense Refill  . amLODipine (NORVASC) 5 MG tablet Take 1 tablet (5 mg total) by mouth daily. 90 tablet 0  . Ascorbic Acid (VITAMIN C WITH ROSE HIPS) 500 MG tablet Take 500 mg by mouth daily.     Marland Kitchen aspirin 325 MG tablet Take 325 mg by mouth daily.      Marland Kitchen  atorvastatin (LIPITOR) 40 MG tablet Take 40 mg by mouth daily.     Marland Kitchen doxazosin (CARDURA) 2 MG tablet Take 1 tablet (2 mg total) by mouth at bedtime. 90 tablet 1  . fish oil-omega-3 fatty acids 1000 MG capsule Take 3 g by mouth 2 (two) times daily.     . hydrALAZINE (APRESOLINE) 100 MG tablet Take 100 mg by mouth 2 (two) times daily.     . hydrochlorothiazide (HYDRODIURIL) 25 MG tablet Take 1 tablet (25 mg total) by mouth daily. 90 tablet 1  . ketoconazole (NIZORAL) 2 % cream Apply 1 application topically daily as needed for irritation.    . Misc Natural Products (OSTEO BI-FLEX ADV JOINT SHIELD PO) Take 1 tablet by mouth 2 (two) times daily.     . Multiple Vitamin  (MULTIVITAMIN WITH MINERALS) TABS Take 1 tablet by mouth daily.    . Multiple Vitamins-Minerals (OCUVITE-LUTEIN PO) Take 1 tablet by mouth daily.     . pimecrolimus (ELIDEL) 1 % cream Apply 1 application topically as needed. For rosacea    . potassium chloride (KLOR-CON M10) 10 MEQ tablet Take two tablets by mouth once daily. Please call and schedule an appointment for December 180 tablet 0  . psyllium (METAMUCIL) 58.6 % powder Take 1 packet by mouth daily.     Marland Kitchen selenium sulfide (SELSUN) 2.5 % shampoo As directed  0  . vitamin B-12 (CYANOCOBALAMIN) 100 MCG tablet Take 100 mcg by mouth daily.    Marland Kitchen doxazosin (CARDURA) 2 MG tablet TAKE ONE TABLET BY MOUTH AT BEDTIME 90 tablet 0  . hydrochlorothiazide (HYDRODIURIL) 25 MG tablet TAKE ONE TABLET BY MOUTH ONCE DAILY 90 tablet 3   No facility-administered medications prior to visit.      Allergies:   Micardis [telmisartan]; Atenolol; and Metoprolol   Social History   Social History  . Marital status: Married    Spouse name: N/A  . Number of children: 2  . Years of education: N/A   Occupational History  . retired Retired   Social History Main Topics  . Smoking status: Former Smoker    Packs/day: 1.00    Years: 5.00    Types: Cigarettes    Quit date: 06/12/1958  . Smokeless tobacco: Never Used  . Alcohol use 3.5 oz/week    7 Standard drinks or equivalent per week     Comment: social  . Drug use: No  . Sexual activity: Not on file   Other Topics Concern  . Not on file   Social History Narrative  . No narrative on file     Family History:  The patient's family history includes Cancer - Prostate in his father; Mastocytosis in his brother; Skin cancer in his father.   ROS:   Please see the history of present illness.    ROS All other systems reviewed and are negative.  No flowsheet data found.     PHYSICAL EXAM:   VS:  BP 118/60   Pulse 78   Ht 5\' 10"  (1.778 m)   Wt 186 lb (84.4 kg)   BMI 26.69 kg/m    GEN: Well  nourished, well developed, in no acute distress  HEENT: normal  Neck: no JVD or masses.  Bilateral carotid bruits Cardiac: RRR; no rubs, or gallops,no edema.  Intact distal pulses bilaterally. 2/6 SM at RUSB best heard in upright position Respiratory:  clear to auscultation bilaterally, normal work of breathing GI: soft, nontender, nondistended, + BS MS: no deformity or atrophy  Skin: warm and dry, no rash Neuro:  Alert and Oriented x 3, Strength and sensation are intact Psych: euthymic mood, full affect  Wt Readings from Last 3 Encounters:  05/22/16 186 lb (84.4 kg)  05/21/15 192 lb 6.4 oz (87.3 kg)  11/06/14 192 lb (87.1 kg)      Studies/Labs Reviewed:   EKG:  EKG is was ordered today.  The ekg ordered today demonstrates NSR at 69bpm with no ST changes  Recent Labs: No results found for requested labs within last 8760 hours.   Lipid Panel    Component Value Date/Time   CHOL 168 09/15/2013 0959   TRIG 33.0 09/15/2013 0959   HDL 92.20 09/15/2013 0959   CHOLHDL 2 09/15/2013 0959   VLDL 6.6 09/15/2013 0959   LDLCALC 69 09/15/2013 0959    Additional studies/ records that were reviewed today include:  none    ASSESSMENT:    1. Coronary artery disease involving native coronary artery of native heart without angina pectoris   2. Bilateral carotid artery disease ([place])   3. Essential hypertension   4. Pure hypercholesterolemia   5. Heart murmur      PLAN:  In order of problems listed above:  1. ASCAD with mild nonobstructive ASCAD with a 30-40% ostial left circ, 50% first diagonal and normal LVF .  He has not had any angina.  Continue statin and ASA.  Decrease ASA to 81mg  daily.   2. Moderate Carotid artery stenosis (40-59% bilateral) - continue statin and ASA.  Repeat dopplers in 1 year. 3. HTN - BP controlled on current meds.  Continue amlodipine/cardura/hydralazine/diuretic.  Check BMET from PCP. 4. Hyperlipidemia with LDL goal < 70.  Continue statin.  Check FLP  and ALT from PCP 5. Heart murmur - I will get a 2D echo to evaluate    Medication Adjustments/Labs and Tests Ordered: Current medicines are reviewed at length with the patient today.  Concerns regarding medicines are outlined above.  Medication changes, Labs and Tests ordered today are listed in the Patient Instructions below.  There are no Patient Instructions on file for this visit.   Signed, Fransico Him, MD  05/22/2016 11:28 AM    [address], [place], [place]  [id number] Phone: [phone]; Fax: [phone]

## 2016-05-22 NOTE — Patient Instructions (Signed)
Medication Instructions:  Your physician recommends that you continue on your current medications as directed. Please refer to the Current Medication list given to you today.   Labwork: None  Testing/Procedures: Your physician has requested that you have an echocardiogram. Echocardiography is a painless test that uses sound waves to create images of your heart. It provides your doctor with information about the size and shape of your heart and how well your heart's chambers and valves are working. This procedure takes approximately one hour. There are no restrictions for this procedure.   Your physician has requested that you have a carotid duplex in November, 2018. This test is an ultrasound of the carotid arteries in your neck. It looks at blood flow through these arteries that supply the brain with blood. Allow one hour for this exam. There are no restrictions or special instructions.  Follow-Up: Your physician wants you to follow-up in: 1 year with Dr. Radford Pax. You will receive a reminder letter in the mail two months in advance. If you don't receive a letter, please call our office to schedule the follow-up appointment.   Any Other Special Instructions Will Be Listed Below (If Applicable).     If you need a refill on your cardiac medications before your next appointment, please call your pharmacy.

## 2016-06-23 ENCOUNTER — Ambulatory Visit (HOSPITAL_COMMUNITY): Payer: Medicare Other | Attending: Cardiology

## 2016-06-23 ENCOUNTER — Other Ambulatory Visit: Payer: Self-pay

## 2016-06-23 DIAGNOSIS — I10 Essential (primary) hypertension: Secondary | ICD-10-CM | POA: Insufficient documentation

## 2016-06-23 DIAGNOSIS — I071 Rheumatic tricuspid insufficiency: Secondary | ICD-10-CM | POA: Diagnosis not present

## 2016-06-23 DIAGNOSIS — I2510 Atherosclerotic heart disease of native coronary artery without angina pectoris: Secondary | ICD-10-CM | POA: Diagnosis not present

## 2016-06-23 DIAGNOSIS — E785 Hyperlipidemia, unspecified: Secondary | ICD-10-CM | POA: Diagnosis not present

## 2016-06-23 DIAGNOSIS — R011 Cardiac murmur, unspecified: Secondary | ICD-10-CM | POA: Insufficient documentation

## 2016-08-17 ENCOUNTER — Other Ambulatory Visit: Payer: Self-pay | Admitting: Neurosurgery

## 2016-08-17 ENCOUNTER — Ambulatory Visit
Admission: RE | Admit: 2016-08-17 | Discharge: 2016-08-17 | Disposition: A | Discharge status: Home / Self Care | Payer: Medicare Other | Source: Ambulatory Visit | Attending: Neurosurgery | Admitting: Neurosurgery

## 2016-08-17 DIAGNOSIS — S32010A Wedge compression fracture of first lumbar vertebra, initial encounter for closed fracture: Secondary | ICD-10-CM

## 2016-11-09 ENCOUNTER — Other Ambulatory Visit: Payer: Self-pay | Admitting: Family Medicine

## 2016-11-09 DIAGNOSIS — R1033 Periumbilical pain: Secondary | ICD-10-CM

## 2016-11-10 ENCOUNTER — Ambulatory Visit
Admission: RE | Admit: 2016-11-10 | Discharge: 2016-11-10 | Disposition: A | Discharge status: Home / Self Care | Payer: Medicare Other | Source: Ambulatory Visit | Attending: Family Medicine | Admitting: Family Medicine

## 2016-11-10 DIAGNOSIS — R1033 Periumbilical pain: Secondary | ICD-10-CM

## 2016-11-10 MED ORDER — IOPAMIDOL (ISOVUE-300) INJECTION 61%
100.0000 mL | Freq: Once | INTRAVENOUS | Status: AC | PRN
  Administered 2016-11-10: 100 mL via INTRAVENOUS

## 2017-04-14 ENCOUNTER — Other Ambulatory Visit: Payer: Self-pay | Admitting: Cardiology

## 2017-04-23 ENCOUNTER — Encounter (HOSPITAL_COMMUNITY): Payer: Medicare Other

## 2022-11-11 DEATH — deceased
